# Patient Record
Sex: Female | Born: 1975 | Hispanic: No | Marital: Married | Smoking: Never smoker
Health system: Southern US, Community
[De-identification: ages and names within clinical notes are randomized; demographics above are authoritative.]

## PROBLEM LIST (undated history)

## (undated) DIAGNOSIS — L409 Psoriasis, unspecified: Secondary | ICD-10-CM

## (undated) DIAGNOSIS — K219 Gastro-esophageal reflux disease without esophagitis: Secondary | ICD-10-CM

## (undated) DIAGNOSIS — D582 Other hemoglobinopathies: Secondary | ICD-10-CM

## (undated) DIAGNOSIS — T8859XA Other complications of anesthesia, initial encounter: Secondary | ICD-10-CM

## (undated) DIAGNOSIS — F419 Anxiety disorder, unspecified: Secondary | ICD-10-CM

## (undated) DIAGNOSIS — G43909 Migraine, unspecified, not intractable, without status migrainosus: Secondary | ICD-10-CM

## (undated) DIAGNOSIS — O24419 Gestational diabetes mellitus in pregnancy, unspecified control: Secondary | ICD-10-CM

## (undated) DIAGNOSIS — T4145XA Adverse effect of unspecified anesthetic, initial encounter: Secondary | ICD-10-CM

## (undated) HISTORY — DX: Psoriasis, unspecified: L40.9

## (undated) HISTORY — PX: EYE SURGERY: SHX253

## (undated) HISTORY — DX: Other hemoglobinopathies: D58.2

## (undated) HISTORY — PX: REFRACTIVE SURGERY: SHX103

## (undated) HISTORY — DX: Migraine, unspecified, not intractable, without status migrainosus: G43.909

## (undated) HISTORY — PX: BREAST CYST EXCISION: SHX579

## (undated) HISTORY — DX: Gestational diabetes mellitus in pregnancy, unspecified control: O24.419

## (undated) HISTORY — DX: Adverse effect of unspecified anesthetic, initial encounter: T41.45XA

## (undated) HISTORY — PX: APPENDECTOMY: SHX54

## (undated) HISTORY — DX: Anxiety disorder, unspecified: F41.9

## (undated) HISTORY — DX: Gastro-esophageal reflux disease without esophagitis: K21.9

## (undated) HISTORY — PX: OTHER SURGICAL HISTORY: SHX169

## (undated) HISTORY — DX: Other complications of anesthesia, initial encounter: T88.59XA

---

## 2012-04-12 ENCOUNTER — Encounter: Payer: Self-pay | Admitting: Family Medicine

## 2012-04-12 ENCOUNTER — Telehealth: Payer: Self-pay | Admitting: Family Medicine

## 2012-04-12 ENCOUNTER — Ambulatory Visit (INDEPENDENT_AMBULATORY_CARE_PROVIDER_SITE_OTHER): Payer: No Typology Code available for payment source | Admitting: Family Medicine

## 2012-04-12 VITALS — BP 102/78 | HR 91 | Temp 98.3°F | Ht 69.0 in | Wt 179.0 lb

## 2012-04-12 DIAGNOSIS — Z7689 Persons encountering health services in other specified circumstances: Secondary | ICD-10-CM

## 2012-04-12 DIAGNOSIS — F419 Anxiety disorder, unspecified: Secondary | ICD-10-CM

## 2012-04-12 DIAGNOSIS — G43909 Migraine, unspecified, not intractable, without status migrainosus: Secondary | ICD-10-CM

## 2012-04-12 DIAGNOSIS — Z7189 Other specified counseling: Secondary | ICD-10-CM

## 2012-04-12 DIAGNOSIS — F411 Generalized anxiety disorder: Secondary | ICD-10-CM

## 2012-04-12 LAB — LIPID PANEL
Cholesterol: 156 mg/dL (ref 0–200)
LDL Cholesterol: 94 mg/dL (ref 0–99)
Triglycerides: 49 mg/dL (ref 0.0–149.0)

## 2012-04-12 LAB — BASIC METABOLIC PANEL
BUN: 10 mg/dL (ref 6–23)
Calcium: 9.4 mg/dL (ref 8.4–10.5)
Chloride: 106 mEq/L (ref 96–112)
Creatinine, Ser: 0.8 mg/dL (ref 0.4–1.2)
GFR: 85.45 mL/min (ref >60.00)

## 2012-04-12 LAB — CBC WITH DIFFERENTIAL/PLATELET
Basophils Absolute: 0 10*3/uL (ref 0.0–0.1)
Basophils Relative: 0.2 % (ref 0.0–3.0)
Eosinophils Absolute: 0.1 10*3/uL (ref 0.0–0.7)
Hemoglobin: 12.4 g/dL (ref 12.0–15.0)
Lymphocytes Relative: 28.7 % (ref 12.0–46.0)
Lymphs Abs: 1.7 10*3/uL (ref 0.7–4.0)
MCHC: 32.4 g/dL (ref 30.0–36.0)
MCV: 71.7 fl — ABNORMAL LOW (ref 78.0–100.0)
Monocytes Absolute: 0.3 10*3/uL (ref 0.1–1.0)
Neutro Abs: 3.8 10*3/uL (ref 1.4–7.7)
RBC: 5.32 Mil/uL — ABNORMAL HIGH (ref 3.87–5.11)
RDW: 14.8 % — ABNORMAL HIGH (ref 11.5–14.6)

## 2012-04-12 LAB — HEMOGLOBIN A1C: Hgb A1c MFr Bld: 6.2 % (ref 4.6–6.5)

## 2012-04-12 NOTE — Patient Instructions (Addendum)
-  We have ordered labs or studies at this visit. It can take up to 1-2 weeks for results and processing. We will contact you with instructions IF your results are abnormal. Normal results will be released to your [facility]. If you have not heard from [place] or can not find your results in [facility] in 2 weeks please contact our office.  -We placed a referral for you as discussed to a neurologist. It usually takes about 1-2 weeks to process and schedule this referral. If you have not heard from [place] regarding this appointment in 2 weeks please contact our office.   -PLEASE SIGN UP FOR MYCHART TODAY   We recommend the following healthy lifestyle measures: - eat a healthy diet consisting of lots of vegetables, fruits, beans, nuts, seeds, healthy meats such as white chicken and fish and whole grains.  - avoid fried foods, fast food, processed foods, sodas, red meet and other fattening foods.  - get a least 150 minutes of aerobic exercise per week.   Follow up in: 1 month  Schedule and appointment with an eye doctor and with counselor

## 2012-04-12 NOTE — Telephone Encounter (Signed)
Please let patient know,   -labs look pretty good for the most part -diabetes screening lab is a little high (>5.6) indicating a risk for developing diabetes  The best treatment to hopefully reverse these findings and prevent adverse health outcomes is a healthy diet and regular exercise.  Follow up as scheduled

## 2012-04-12 NOTE — Progress Notes (Signed)
Chief Complaint  Patient presents with  . Establish Care    HPI:  Diane Kirk is here to establish care. Recently moved to the area. Last PCP and physical: Dr. Veatrice Bourbon - Family [facility], Dr. 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 Migraine   Has a history of Vit D Deficiency.  Migraine headaches > 1 year - reports Fioricet prescribed by last doctor, had CT last week at ED and was normal. Wants to see neurologist. Gets these headaches a few times per month, usually R sided, associated with nausea, blurred vision. Recently started on triptan to use for headaches. Has tried amitriptyline and other meds as well with prior PCP. Pt also has anxiety, trouble sleeping, does not want to take medications. Sometimes has tremor in hand when anxious. Sometimes has night sweats - has had this chronically. Denies weight loss, anorexia, change in bowels, fevers.  Health Maintenance: -refused flu   ROS: See pertinent positives and negatives per HPI.  History reviewed. No pertinent past medical history.  Family History  Problem Relation Age of Onset  . Hypertension Mother   . Hyperlipidemia Mother   . Hypertension Father   . Hyperlipidemia Father   . Stroke Father 94  . Diabetes Paternal Aunt   . Heart disease Paternal Uncle 71  . Cancer Maternal Grandmother     breast    History   Social History  . Marital Status: Married    Spouse Name: N/A    Number of Children: N/A  . Years of Education: N/A   Social History Main Topics  . Smoking status: Never Smoker   . Smokeless tobacco: None  . Alcohol Use: No  . Drug Use: None  . Sexually Active: Yes   Other Topics Concern  . None   Social  History Narrative  . None    Current outpatient prescriptions:Multiple Vitamin (MULTIVITAMIN) tablet, Take 1 tablet by mouth daily., Disp: , Rfl:   EXAM:  Filed Vitals:   04/12/12 0819  BP: 102/78  Pulse: 91  Temp: 98.3 F (36.8 C)    Body mass index is 26.43 kg/(m^2).  GENERAL: vitals reviewed and listed above, alert, oriented, appears well hydrated and in no acute distress  HEENT: atraumatic, conjunttiva clear, no obvious abnormalities on inspection of external nose and ears  NECK: no obvious masses on inspection  LUNGS: clear to auscultation bilaterally, no wheezes, rales or rhonchi, good air movement  CV: HRRR, no peripheral edema  MS: moves all extremities without noticeable abnormality  PSYCH: pleasant and cooperative, no obvious depression or anxiety  ASSESSMENT AND PLAN:  Discussed the following assessment and plan:  1. Migraine  -CT neg -sounds like migraine - offered to tx - pt wants to see neurologist - referral placed Ambulatory referral to Neurology  2. Establishing care with new doctor, encounter for   Hemoglobin A1c, Lipid Panel, CBC with Differential, Basic metabolic panel  3. Anxiety  -advised counseling Follow up 1 month, may consider adding TCA or SSRI   -We reviewed the PMH, PSH, FH, SH, Meds and Allergies. -We provided refills for any medications we will prescribe as needed. -We addressed current concerns per orders and patient instructions. -We have asked for records  for pertinent exams, studies, vaccines and notes from previous providers. -We have advised patient to follow up per instructions below. -Influenza vaccine given today  -Patient advised to return or notify a doctor immediately if symptoms worsen or persist or new concerns arise.  Patient Instructions  -We have ordered labs or studies at this visit. It can take up to 1-2 weeks for results and processing. We will contact you with instructions IF your results are abnormal. Normal  results will be released to your [facility]. If you have not heard from [place] or can not find your results in [facility] in 2 weeks please contact our office.  -We placed a referral for you as discussed to a neurologist. It usually takes about 1-2 weeks to process and schedule this referral. If you have not heard from [place] regarding this appointment in 2 weeks please contact our office.   -PLEASE SIGN UP FOR MYCHART TODAY   We recommend the following healthy lifestyle measures: - eat a healthy diet consisting of lots of vegetables, fruits, beans, nuts, seeds, healthy meats such as white chicken and fish and whole grains.  - avoid fried foods, fast food, processed foods, sodas, red meet and other fattening foods.  - get a least 150 minutes of aerobic exercise per week.   Follow up in: 1 month  Schedule and appointment with an eye doctor and with counselor        Terressa Koyanagi.

## 2012-04-13 ENCOUNTER — Telehealth: Payer: Self-pay | Admitting: Family Medicine

## 2012-04-13 NOTE — Telephone Encounter (Signed)
Please let pt know - in regards to her question.  No, only mildly outside of range and likely means nothing. Advised healthy diet and regular exercise and can recheck next time we do labs.

## 2012-04-13 NOTE — Telephone Encounter (Signed)
Mychart message sent to patient.

## 2012-04-16 NOTE — Telephone Encounter (Signed)
Pt aware.

## 2012-05-04 ENCOUNTER — Encounter: Payer: Self-pay | Admitting: Family Medicine

## 2012-05-04 NOTE — Progress Notes (Signed)
Received OV note from [facility] from 05/02/12. Dx Migraines and Lhermitte's phenomenon. Neuro Plan: topamax, zomig prn, MRI brain and cervical spine and follow up in 3 months.

## 2012-05-12 ENCOUNTER — Other Ambulatory Visit: Payer: Self-pay

## 2012-05-15 ENCOUNTER — Encounter: Payer: No Typology Code available for payment source | Admitting: Family Medicine

## 2012-05-15 NOTE — Progress Notes (Signed)
No show   This encounter was created in error - please disregard.  This encounter was created in error - please disregard.

## 2012-06-07 ENCOUNTER — Ambulatory Visit (INDEPENDENT_AMBULATORY_CARE_PROVIDER_SITE_OTHER): Payer: No Typology Code available for payment source | Admitting: Family Medicine

## 2012-06-07 ENCOUNTER — Encounter: Payer: Self-pay | Admitting: Family Medicine

## 2012-06-07 VITALS — BP 100/70 | HR 81 | Temp 99.1°F | Wt 179.0 lb

## 2012-06-07 DIAGNOSIS — R7303 Prediabetes: Secondary | ICD-10-CM

## 2012-06-07 DIAGNOSIS — F411 Generalized anxiety disorder: Secondary | ICD-10-CM

## 2012-06-07 DIAGNOSIS — G43909 Migraine, unspecified, not intractable, without status migrainosus: Secondary | ICD-10-CM

## 2012-06-07 DIAGNOSIS — R7309 Other abnormal glucose: Secondary | ICD-10-CM

## 2012-06-07 NOTE — Progress Notes (Signed)
Chief Complaint  Patient presents with  . 1 month follow up    HPI:  Follow up:  Anxiety: -advised counseling last visit - she reports did not do this -has been doing a Public librarian class and this has helped immensely  -exercise and support: has a great support   Migraines and Lherrmitte penomenom: -per neuro notes -started on topamax and zomig  -reports MRI was all good and told symptoms were Migraines - has follow up in May -headaches have improved significantly - she is not taking the topamax - using the zomig sparingly  Prediabetes: -on recent labs -is trying to eat healthy and is going to start exercising  ROS: See pertinent positives and negatives per HPI.  No past medical history on file.  Family History  Problem Relation Age of Onset  . Hypertension Mother   . Hyperlipidemia Mother   . Hypertension Father   . Hyperlipidemia Father   . Stroke Father 35  . Diabetes Paternal Aunt   . Heart disease Paternal Uncle 46  . Cancer Maternal Grandmother     breast    History   Social History  . Marital Status: Married    Spouse Name: N/A    Number of Children: N/A  . Years of Education: N/A   Social History Main Topics  . Smoking status: Never Smoker   . Smokeless tobacco: None  . Alcohol Use: No  . Drug Use: None  . Sexually Active: Yes   Other Topics Concern  . None   Social History Narrative  . None    Current outpatient prescriptions:Multiple Vitamin (MULTIVITAMIN) tablet, Take 1 tablet by mouth daily., Disp: , Rfl: ;  zolmitriptan (ZOMIG-ZMT) 2.5 MG disintegrating tablet, Take 2.5 mg by mouth as needed. , Disp: , Rfl:   EXAM:  Filed Vitals:   06/07/12 1024  BP: 100/70  Pulse: 81  Temp: 99.1 F (37.3 C)    Body mass index is 26.42 kg/(m^2).  GENERAL: vitals reviewed and listed above, alert, oriented, appears well hydrated and in no acute distress  HEENT: atraumatic, conjunttiva clear, no obvious abnormalities on inspection of  external nose and ears  NECK: no obvious masses on inspection  LUNGS: clear to auscultation bilaterally, no wheezes, rales or rhonchi, good air movement  CV: HRRR, no peripheral edema  MS: moves all extremities without noticeable abnormality  PSYCH: pleasant and cooperative, no obvious depression or anxiety  ASSESSMENT AND PLAN:  Discussed the following assessment and plan:  Migraine  Generalized anxiety disorder  Prediabetes  -doing well -discussed healthy lifestyle recs at length -goal to start exercise videos and improve diet, continue spiritual support -has follow up with neuro for her migraines - but is doing much better, has not used zomig in one month and not taking topamax -follow up in 6 months or prn -Patient advised to return or notify a doctor immediately if symptoms worsen or persist or new concerns arise.  There are no Patient Instructions on file for this visit.   Kriste Basque R.

## 2012-07-12 ENCOUNTER — Ambulatory Visit (INDEPENDENT_AMBULATORY_CARE_PROVIDER_SITE_OTHER): Payer: No Typology Code available for payment source | Admitting: Family Medicine

## 2012-07-12 ENCOUNTER — Telehealth: Payer: Self-pay | Admitting: Family Medicine

## 2012-07-12 ENCOUNTER — Ambulatory Visit: Payer: No Typology Code available for payment source | Admitting: Family

## 2012-07-12 ENCOUNTER — Encounter: Payer: Self-pay | Admitting: Family Medicine

## 2012-07-12 ENCOUNTER — Other Ambulatory Visit (HOSPITAL_COMMUNITY)
Admission: RE | Admit: 2012-07-12 | Discharge: 2012-07-12 | Disposition: A | Discharge status: Home / Self Care | Payer: No Typology Code available for payment source | Source: Ambulatory Visit | Attending: Family Medicine | Admitting: Family Medicine

## 2012-07-12 VITALS — BP 106/70 | HR 73 | Temp 98.1°F | Wt 175.0 lb

## 2012-07-12 DIAGNOSIS — N760 Acute vaginitis: Secondary | ICD-10-CM | POA: Insufficient documentation

## 2012-07-12 DIAGNOSIS — N926 Irregular menstruation, unspecified: Secondary | ICD-10-CM

## 2012-07-12 DIAGNOSIS — N898 Other specified noninflammatory disorders of vagina: Secondary | ICD-10-CM

## 2012-07-12 NOTE — Telephone Encounter (Signed)
Patient Information:  Caller Name: Serina  Phone: [phone]  Patient: Diane Kirk, Diane Kirk  Gender: Female  DOB: 1975-11-27  Age: 37 Years  PCP: Kriste Basque [facility])  Pregnant: No  Office Follow Up:  Does the office need to follow up with this patient?: No  Instructions For The Office: N/A   Symptoms  Reason For Call & Symptoms: Patient reports bleeding intermittently for 3 weeks.  Pinkish color; wears tampons with very little on them.  LMP 06/07/12 with light spotting; went away and returned around 06/25/12.  Patient wants to be seen.  Reviewed Health History In EMR: Yes  Reviewed Medications In EMR: Yes  Reviewed Allergies In EMR: Yes  Reviewed Surgeries / Procedures: Yes  Date of Onset of Symptoms: 06/25/2012 OB / GYN:  LMP: 06/07/2012  Guideline(s) Used:  Vaginal Bleeding - Abnormal  Disposition Per Guideline:   See Today in Office  Reason For Disposition Reached:   Patient wants to be seen  Advice Given:  Pregnancy Test, When in Doubt:  If there is a chance that you might be pregnant, use a urine pregnancy test.  It works best if you test your first urine in the morning.  Call Back If:  Pregnancy test is positive  Bleeding becomes worse  You become worse.  Patient Will Follow Care Advice:  YES  Appointment Scheduled:  07/12/2012 15:45:00 Appointment Scheduled Provider:  Adline Mango [facility])

## 2012-07-12 NOTE — Progress Notes (Signed)
Chief Complaint  Patient presents with  . Vaginal Discharge    times 3 weeks    HPI:  Vaginal discharge: -FDLMP: 06/07/12 - normal -reports periods have always been irr her whole life - sometimes 2 weeks sometimes 5 weeks -had pinkish tinged on TP when wiped on march 31st - has had a few episodes of white sometimes pinkish tinged discharge on TP when wipes -not getting blood on underwear - has worn a tampon a few days with nothing on tampon -no odor, no pain or pruritis, fevers, urinary or bowel issues -she goes to [facility] - had pelvic and pap in November -not on birth control -no new sexual partners - only with husband  ROS: See pertinent positives and negatives per HPI.  No past medical history on file.  Family History  Problem Relation Age of Onset  . Hypertension Mother   . Hyperlipidemia Mother   . Hypertension Father   . Hyperlipidemia Father   . Stroke Father 84  . Diabetes Paternal Aunt   . Heart disease Paternal Uncle 46  . Cancer Maternal Grandmother     breast    History   Social History  . Marital Status: Married    Spouse Name: N/A    Number of Children: N/A  . Years of Education: N/A   Social History Main Topics  . Smoking status: Never Smoker   . Smokeless tobacco: None  . Alcohol Use: No  . Drug Use: None  . Sexually Active: Yes   Other Topics Concern  . None   Social History Narrative  . None    Current outpatient prescriptions:Multiple Vitamin (MULTIVITAMIN) tablet, Take 1 tablet by mouth daily., Disp: , Rfl: ;  zolmitriptan (ZOMIG-ZMT) 2.5 MG disintegrating tablet, Take 2.5 mg by mouth as needed. , Disp: , Rfl:   EXAM:  Filed Vitals:   07/12/12 1558  BP: 106/70  Pulse: 73  Temp: 98.1 F (36.7 C)    Body mass index is 25.83 kg/(m^2).  GENERAL: vitals reviewed and listed above, alert, oriented, appears well hydrated and in no acute distress  HEENT: atraumatic, conjunttiva clear, no obvious abnormalities on inspection of  external nose and ears  NECK: no obvious masses on inspection  LUNGS: clear to auscultation bilaterally, no wheezes, rales or rhonchi, good air movement  CV: HRRR, no peripheral edema  ABD: normal  GU: normal appearance of ext genitalia and vagina, very small amount cervical bleeding, white homogenous vaginal discharge, no CMT   MS: moves all extremities without noticeable abnormality  PSYCH: pleasant and cooperative, no obvious depression or anxiety  ASSESSMENT AND PLAN:  Discussed the following assessment and plan:  Irregular bleeding - Plan: Cervicovaginal ancillary only  Vaginal discharge  -wet prep pending -seems like very sm amount of possible uterine bleeding with hx of irr  Periods her whole life. Discussed workup and tx options. She will wait on wet prep results and if persists see gyn. Advised if heavy beelding to see a doctor immediately.  Would likely benfit from OCPs to regulate cycles. No heavy bleeding at this time. -Patient advised to return or notify a doctor immediately if symptoms worsen or persist or new concerns arise.  There are no Patient Instructions on file for this visit.   Kriste Basque R.

## 2012-07-17 ENCOUNTER — Telehealth: Payer: Self-pay | Admitting: Family Medicine

## 2012-07-17 DIAGNOSIS — A5901 Trichomonal vulvovaginitis: Secondary | ICD-10-CM

## 2012-07-17 MED ORDER — METRONIDAZOLE 500 MG PO TABS: 2000.0000 mg | ORAL_TABLET | Freq: Once | ORAL | Status: DC

## 2012-07-17 NOTE — Telephone Encounter (Signed)
Caller: Danaly/Patient; Phone: [phone]; Reason for Call: Patient called requesting to have a retest because she is confused by the results (trichomonas) of her test.  She advised RN that she was planning to follow up with her Gyn physician for this retest.  Thank you,

## 2012-07-17 NOTE — Telephone Encounter (Signed)
Called to discuss results with patient. Answered questions. Pt wants to retest tomorrow when she see her gyn and will do testing for other STIs as well then.

## 2012-07-17 NOTE — Telephone Encounter (Signed)
Please let her know: -test results showed trichamonas. This can be there for many many years - so may not be something new. It is a sexually transmitted infection It could be the cause of her pink discharge. We can treat this with one dose of medicine. Sent to pharmacy. Metronidazole 2 gram x1.

## 2012-07-17 NOTE — Telephone Encounter (Signed)
Called and spoke with pt and pt is aware.  

## 2012-08-02 DIAGNOSIS — R0602 Shortness of breath: Secondary | ICD-10-CM | POA: Insufficient documentation

## 2012-08-02 DIAGNOSIS — K3189 Other diseases of stomach and duodenum: Secondary | ICD-10-CM | POA: Insufficient documentation

## 2012-08-02 DIAGNOSIS — R1013 Epigastric pain: Secondary | ICD-10-CM | POA: Insufficient documentation

## 2012-08-02 DIAGNOSIS — R630 Anorexia: Secondary | ICD-10-CM | POA: Insufficient documentation

## 2012-08-02 DIAGNOSIS — K219 Gastro-esophageal reflux disease without esophagitis: Secondary | ICD-10-CM | POA: Insufficient documentation

## 2012-08-03 ENCOUNTER — Other Ambulatory Visit: Payer: Self-pay

## 2012-08-03 ENCOUNTER — Emergency Department (HOSPITAL_COMMUNITY)
Admission: EM | Admit: 2012-08-03 | Discharge: 2012-08-03 | Disposition: A | Discharge status: Home / Self Care | Payer: No Typology Code available for payment source | Attending: Emergency Medicine | Admitting: Emergency Medicine

## 2012-08-03 ENCOUNTER — Encounter (HOSPITAL_COMMUNITY): Payer: Self-pay | Admitting: Emergency Medicine

## 2012-08-03 DIAGNOSIS — K219 Gastro-esophageal reflux disease without esophagitis: Secondary | ICD-10-CM

## 2012-08-03 LAB — CBC
MCV: 66.6 fL — ABNORMAL LOW (ref 78.0–100.0)
Platelets: 237 10*3/uL (ref 150–400)
RBC: 5.51 MIL/uL — ABNORMAL HIGH (ref 3.87–5.11)
RDW: 13.9 % (ref 11.5–15.5)
WBC: 8.5 10*3/uL (ref 4.0–10.5)

## 2012-08-03 LAB — POCT I-STAT, CHEM 8
BUN: 4 mg/dL — ABNORMAL LOW (ref 6–23)
Calcium, Ion: 1.24 mmol/L — ABNORMAL HIGH (ref 1.12–1.23)
Creatinine, Ser: 0.9 mg/dL (ref 0.50–1.10)
Hemoglobin: 14.6 g/dL (ref 12.0–15.0)
TCO2: 24 mmol/L (ref 0–100)

## 2012-08-03 LAB — POCT I-STAT TROPONIN I
Troponin i, poc: 0 ng/mL (ref 0.00–0.08)
Troponin i, poc: 0 ng/mL (ref 0.00–0.08)

## 2012-08-03 LAB — D-DIMER, QUANTITATIVE: D-Dimer, Quant: <0.27 ug/mL-FEU (ref 0.00–0.48)

## 2012-08-03 MED ORDER — OMEPRAZOLE 20 MG PO CPDR: 20.0000 mg | DELAYED_RELEASE_CAPSULE | Freq: Every day | ORAL | Status: DC

## 2012-08-03 MED ORDER — GI COCKTAIL ~~LOC~~
30.0000 mL | Freq: Once | ORAL | Status: AC
  Administered 2012-08-03: 30 mL via ORAL
  Filled 2012-08-03: qty 30

## 2012-08-03 NOTE — ED Notes (Signed)
Patient with chest pain and shortness of breath that started around 8pm tonight.  Patient states that the pain changed from sharp to burning sensation.  Patient continues with the burning sensation at this time.

## 2012-08-03 NOTE — ED Provider Notes (Signed)
History     CSN: [id number]  Arrival date & time 08/02/12  2354   First MD Initiated Contact with Patient 08/03/12 0033      Chief Complaint  Patient presents with  . Chest Pain  . Shortness of Breath    (Consider location/radiation/quality/duration/timing/severity/associated sxs/prior treatment) Patient is a 37 y.o. female presenting with chest pain and shortness of breath. The history is provided by the patient.  Chest Pain Pain location:  Epigastric Pain quality: burning   Pain radiates to:  Does not radiate Pain radiates to the back: no   Pain severity:  Moderate Onset quality:  Sudden Timing:  Constant Progression:  Unchanged Chronicity:  New Context: no trauma   Relieved by:  Nothing Worsened by:  Nothing tried Associated symptoms: anorexia   Associated symptoms: no cough, no fatigue, no palpitations and not vomiting   Associated symptoms comment:  Dyspepsia x 24 hours Shortness of Breath Associated symptoms: chest pain   Associated symptoms: no cough, no vomiting and no wheezing     History reviewed. No pertinent past medical history.  Past Surgical History  Procedure Laterality Date  . Appendectomy    . Laproscopy    . Ivf    . Breast cyst excision      Family History  Problem Relation Age of Onset  . Hypertension Mother   . Hyperlipidemia Mother   . Hypertension Father   . Hyperlipidemia Father   . Stroke Father 20  . Diabetes Paternal Aunt   . Heart disease Paternal Uncle 75  . Cancer Maternal Grandmother     breast    History  Substance Use Topics  . Smoking status: Never Smoker   . Smokeless tobacco: Not on file  . Alcohol Use: No    OB History   Grav Para Term Preterm Abortions TAB SAB Ect Mult Living                  Review of Systems  Constitutional: Negative for fatigue.  Respiratory: Negative for cough and wheezing.   Cardiovascular: Positive for chest pain. Negative for palpitations and leg swelling.  Gastrointestinal:  Positive for anorexia. Negative for vomiting.  All other systems reviewed and are negative.    Allergies  Azithromycin  Home Medications   Current Outpatient Rx  Name  Route  Sig  Dispense  Refill  . zolmitriptan (ZOMIG-ZMT) 2.5 MG disintegrating tablet   Oral   Take 2.5 mg by mouth as needed.            BP 105/73  Pulse 80  Temp(Src) 97.8 F (36.6 C) (Oral)  Resp 15  Ht 5\' 10"  (1.778 m)  Wt 170 lb (77.111 kg)  BMI 24.39 kg/m2  SpO2 98%  LMP 07/24/2012  Physical Exam  Constitutional: She is oriented to person, place, and time. She appears well-developed and well-nourished. No distress.  HENT:  Head: Normocephalic and atraumatic.  Mouth/Throat: Oropharynx is clear and moist.  Eyes: Conjunctivae are normal. Pupils are equal, round, and reactive to light.  Neck: Normal range of motion. Neck supple.  Cardiovascular: Normal rate, regular rhythm and intact distal pulses.   Pulmonary/Chest: Effort normal and breath sounds normal. She has no wheezes. She has no rales.  Abdominal: Soft. Bowel sounds are increased. There is no tenderness. There is no rebound and no guarding.  Musculoskeletal: Normal range of motion.  Neurological: She is alert and oriented to person, place, and time.  Skin: Skin is warm and dry.  Psychiatric:  She has a normal mood and affect.    ED Course  Procedures (including critical care time)  Labs Reviewed  CBC - Abnormal; Notable for the following:    RBC 5.51 (*)    MCV 66.6 (*)    MCH 23.2 (*)    All other components within normal limits  POCT I-STAT, CHEM 8 - Abnormal; Notable for the following:    BUN 4 (*)    Glucose, Bld 120 (*)    Calcium, Ion 1.24 (*)    All other components within normal limits  D-DIMER, QUANTITATIVE  POCT I-STAT TROPONIN I   No results found.   No diagnosis found.    MDM   Date: 08/03/2012  Rate: 88  Rhythm: normal sinus rhythm  QRS Axis: normal  Intervals: normal  ST/T Wave abnormalities: normal   Conduction Disutrbances: PVC  Narrative Interpretation: unremarkable         Sx consistent with GERD and in the setting of 2 negative troponins and a negative EKG acs is excluded.  Follow up with your family doctor for ongoing care  Harlie Ragle K Elena Davia-Rasch, MD 08/03/12 [phone]

## 2012-10-05 ENCOUNTER — Encounter: Payer: Self-pay | Admitting: Neurology

## 2012-10-05 ENCOUNTER — Ambulatory Visit (INDEPENDENT_AMBULATORY_CARE_PROVIDER_SITE_OTHER): Payer: No Typology Code available for payment source | Admitting: Neurology

## 2012-10-05 ENCOUNTER — Telehealth: Payer: Self-pay | Admitting: Neurology

## 2012-10-05 VITALS — BP 111/70 | HR 74 | Ht 70.25 in | Wt 168.0 lb

## 2012-10-05 DIAGNOSIS — G43909 Migraine, unspecified, not intractable, without status migrainosus: Secondary | ICD-10-CM

## 2012-10-05 MED ORDER — RIZATRIPTAN BENZOATE 10 MG PO TBDP: 10.0000 mg | ORAL_TABLET | Freq: Three times a day (TID) | ORAL | Status: DC | PRN

## 2012-10-05 NOTE — Progress Notes (Signed)
Reason for visit: Migraine headache  Diane Kirk is an 37 y.o. female  History of present illness:  Diane Kirk is a 37 year old right-handed black female with a history of migraine headaches. The patient was given a prescription for Topamax, as she was having frequent headaches. The patient never took the medication because she was afraid of potential side effects. The patient has been on low-dose Zomig taking the 2.5 mg tablets, and she indicates that this has not been very effective for her. The headaches have reduced to having one headache a month, associated with the menstrual cycle. The headache itself however, may last 3 days. The patient indicates that she has done better by avoiding pork, MSG in the diet, and avoiding sweet odors such as perfumes. The patient does have some nausea with the headache. The patient indicates that she may frequently wake up with a slight tremor that dissipates quickly, and she does not have a tremor at other times. The patient returns for an evaluation. MRI of the cervical spine and brain were unremarkable. This was done mainly because of a history of a  Lhermitte's phenomenon.   Past Medical History  Diagnosis Date  . Migraine headache   . Gastroesophageal reflux disease     Past Surgical History  Procedure Laterality Date  . Appendectomy    . Laproscopy    . Ivf    . Breast cyst excision    . Refractive surgery Bilateral   . Sebaceous cyst resection      Scalp    Family History  Problem Relation Age of Onset  . Hypertension Mother   . Hyperlipidemia Mother   . Hypertension Father   . Hyperlipidemia Father   . Stroke Father 17  . Diabetes Paternal Aunt   . Heart disease Paternal Uncle 86  . Cancer Maternal Grandmother     breast    Social history:  reports that she has never smoked. She does not have any smokeless tobacco history on file. She reports that she does not drink alcohol. Her drug history is not on file.  Allergies:   Allergies  Allergen Reactions  . Azithromycin     vertigo    Medications:  Current Outpatient Prescriptions on File Prior to Visit  Medication Sig Dispense Refill  . omeprazole (PRILOSEC) 20 MG capsule Take 1 capsule (20 mg total) by mouth daily.  30 capsule  0   No current facility-administered medications on file prior to visit.    ROS:  Out of a complete 14 system review of symptoms, the patient complains only of the following symptoms, and all other reviewed systems are negative.  Fatigue  Moles  Blurred vision Headache Tremor Insomnia   Blood pressure 111/70, pulse 74, height 5' 10.25" (1.784 m), weight 168 lb (76.204 kg).  Physical Exam  General: The patient is alert and cooperative at the time of the examination.  Skin: No significant peripheral edema is noted.   Neurologic Exam  Cranial nerves: Facial symmetry is present. Speech is normal, no aphasia or dysarthria is noted. Extraocular movements are full. Visual fields are full.  Motor: The patient has good strength in all 4 extremities.  Coordination: The patient has good finger-nose-finger and heel-to-shin bilaterally.  Gait and station: The patient has a normal gait. Tandem gait is normal. Romberg is negative. No drift is seen.  Reflexes: Deep tendon reflexes are symmetric.   Assessment/Plan:  One. Migraine headache, menstrual migraine  The patient is having on average one headache  a month, but the headache is lasting several days. The patient will begin a prescription for Maxalt to take for the headache at its onset. Samples for Frova were given to take for 3 days after the headache begins. The patient will contact me if she believes that Frova is helpful. The patient is off of Topamax, and we will not restart the medication for now. The patient will followup in 6 months.  Diane Palau MD 10/07/2012 7:47 PM  [facility] [address] [place], [place]  [address]  Phone [phone] Fax [phone]

## 2012-10-08 ENCOUNTER — Telehealth: Payer: Self-pay | Admitting: Neurology

## 2012-10-15 ENCOUNTER — Telehealth: Payer: Self-pay | Admitting: Neurology

## 2012-10-15 NOTE — Telephone Encounter (Signed)
PA was sent.  Pending response.  Called patient.  Got no answer.  Left message.

## 2012-11-29 ENCOUNTER — Encounter: Payer: Self-pay | Admitting: Family Medicine

## 2012-11-29 ENCOUNTER — Ambulatory Visit (INDEPENDENT_AMBULATORY_CARE_PROVIDER_SITE_OTHER): Payer: No Typology Code available for payment source | Admitting: Family Medicine

## 2012-11-29 VITALS — BP 104/78 | Temp 98.3°F | Wt 173.0 lb

## 2012-11-29 DIAGNOSIS — R7309 Other abnormal glucose: Secondary | ICD-10-CM

## 2012-11-29 DIAGNOSIS — M549 Dorsalgia, unspecified: Secondary | ICD-10-CM

## 2012-11-29 DIAGNOSIS — R7303 Prediabetes: Secondary | ICD-10-CM

## 2012-11-29 DIAGNOSIS — E559 Vitamin D deficiency, unspecified: Secondary | ICD-10-CM

## 2012-11-29 NOTE — Patient Instructions (Signed)
-  We have ordered labs or studies at this visit. It can take up to 1-2 weeks for results and processing. We will contact you with instructions IF your results are abnormal. Normal results will be released to your [facility]. If you have not heard from [place] or can not find your results in [facility] in 2 weeks please contact our office.  -for back: -excises provided at least 4 times per week -heat for 15 minutes twice daily -for pain can use tylenol 1000mg  up to 2 times daily or ibuprofen 400mg  up to 2 times daily and topical menthol or capsacin sports creams -follow up in 3-4 weeks if persists  We recommend the following healthy lifestyle measures: - eat a healthy diet consisting of lots of vegetables, fruits, beans, nuts, seeds, healthy meats such as white chicken and fish and whole grains.  - avoid fried foods, fast food, processed foods, sodas, red meet and other fattening foods.  - get a least 150 minutes of aerobic exercise per week.

## 2012-11-29 NOTE — Progress Notes (Signed)
Chief Complaint  Patient presents with  . Back Pain    and arm pain x 3 weeks on and off     HPI:  Acute visit for back pain: -started 2-3 weeks ago after sleeping wrong -in upper L back, neck and shoulder -asa helps, has also tried heat  -worse when in lying down positions  -denies: fevers, weakness, numbness in arms, malaise  Prediabetes: -wants to recheck hgba1c -no regular exercise, diet is good  Vit D def: -wants to recheck -not taking any vit D   ROS: See pertinent positives and negatives per HPI.  Past Medical History  Diagnosis Date  . Migraine headache   . Gastroesophageal reflux disease     Past Surgical History  Procedure Laterality Date  . Appendectomy    . Laproscopy    . Ivf    . Breast cyst excision    . Refractive surgery Bilateral   . Sebaceous cyst resection      Scalp    Family History  Problem Relation Age of Onset  . Hypertension Mother   . Hyperlipidemia Mother   . Hypertension Father   . Hyperlipidemia Father   . Stroke Father 28  . Diabetes Paternal Aunt   . Heart disease Paternal Uncle 23  . Cancer Maternal Grandmother     breast    History   Social History  . Marital Status: Married    Spouse Name: N/A    Number of Children: N/A  . Years of Education: N/A   Social History Main Topics  . Smoking status: Never Smoker   . Smokeless tobacco: None  . Alcohol Use: No  . Drug Use: None  . Sexual Activity: Yes   Other Topics Concern  . None   Social History Narrative  . None    Current outpatient prescriptions:omeprazole (PRILOSEC) 20 MG capsule, Take 1 capsule (20 mg total) by mouth daily., Disp: 30 capsule, Rfl: 0;  rizatriptan (MAXALT-MLT) 10 MG disintegrating tablet, Take 1 tablet (10 mg total) by mouth 3 (three) times daily as needed for migraine. May repeat in 2 hours if needed, Disp: 12 tablet, Rfl: 5  EXAM:  Filed Vitals:   11/29/12 0804  BP: 104/78  Temp: 98.3 F (36.8 C)    Body mass index is 24.66  kg/(m^2).  GENERAL: vitals reviewed and listed above, alert, oriented, appears well hydrated and in no acute distress  HEENT: atraumatic, conjunttiva clear, no obvious abnormalities on inspection of external nose and ears  NECK: no obvious masses on inspection  LUNGS: clear to auscultation bilaterally, no wheezes, rales or rhonchi, good air movement  CV: HRRR, no peripheral edema  MS: moves all extremities without noticeable abnormality Normal muscle strength, sensation in upper ext bilat, neg spurling, no bony cervical or thoracic TTP, TTP in L rhomboids and trap, otherwise normal exam, no winging scapula  PSYCH: pleasant and cooperative, no obvious depression or anxiety  ASSESSMENT AND PLAN:  Discussed the following assessment and plan:  Back pain -ms in nature -Recommendations per orders and instructions, risks and use of medications and return precautions discussed.  Prediabetes - Plan: Hemoglobin A1c -advised healthy lifestyle  Unspecified vitamin D deficiency - Plan: Vitamin D, 25-hydroxy  -Patient advised to return or notify a doctor immediately if symptoms worsen or persist or new concerns arise.  Patient Instructions  -We have ordered labs or studies at this visit. It can take up to 1-2 weeks for results and processing. We will contact you with instructions  IF your results are abnormal. Normal results will be released to your [facility]. If you have not heard from [place] or can not find your results in [facility] in 2 weeks please contact our office.  -for back: -excises provided at least 4 times per week -heat for 15 minutes twice daily -for pain can use tylenol 1000mg  up to 2 times daily or ibuprofen 400mg  up to 2 times daily and topical menthol or capsacin sports creams -follow up in 3-4 weeks if persists  We recommend the following healthy lifestyle measures: - eat a healthy diet consisting of lots of vegetables, fruits, beans, nuts, seeds, healthy meats such as white  chicken and fish and whole grains.  - avoid fried foods, fast food, processed foods, sodas, red meet and other fattening foods.  - get a least 150 minutes of aerobic exercise per week.            Kriste Basque R.

## 2012-11-30 NOTE — Progress Notes (Signed)
Quick Note:  Called and spoke with pt and pt is aware. ______ 

## 2013-01-31 ENCOUNTER — Other Ambulatory Visit: Payer: Self-pay

## 2013-04-05 ENCOUNTER — Telehealth: Payer: Self-pay | Admitting: Nurse Practitioner

## 2013-04-05 ENCOUNTER — Ambulatory Visit: Payer: No Typology Code available for payment source | Admitting: Nurse Practitioner

## 2013-04-05 NOTE — Telephone Encounter (Signed)
No show for scheduled appt 

## 2013-10-17 ENCOUNTER — Encounter: Payer: Self-pay | Admitting: *Deleted

## 2014-06-09 ENCOUNTER — Encounter: Payer: Self-pay | Admitting: Family Medicine

## 2014-06-09 ENCOUNTER — Ambulatory Visit (INDEPENDENT_AMBULATORY_CARE_PROVIDER_SITE_OTHER): Payer: No Typology Code available for payment source | Admitting: Family Medicine

## 2014-06-09 VITALS — BP 100/76 | HR 74 | Temp 97.9°F | Ht 69.5 in | Wt 178.6 lb

## 2014-06-09 DIAGNOSIS — J302 Other seasonal allergic rhinitis: Secondary | ICD-10-CM

## 2014-06-09 DIAGNOSIS — L503 Dermatographic urticaria: Secondary | ICD-10-CM

## 2014-06-09 DIAGNOSIS — Z0000 Encounter for general adult medical examination without abnormal findings: Secondary | ICD-10-CM | POA: Diagnosis not present

## 2014-06-09 LAB — LIPID PANEL
CHOL/HDL RATIO: 3
Cholesterol: 141 mg/dL (ref 0–200)
HDL: 49.3 mg/dL (ref >39.00)
LDL Cholesterol: 81 mg/dL (ref 0–99)
NONHDL: 91.7
TRIGLYCERIDES: 56 mg/dL (ref 0.0–149.0)
VLDL: 11.2 mg/dL (ref 0.0–40.0)

## 2014-06-09 LAB — HEMOGLOBIN A1C: Hgb A1c MFr Bld: 5.9 % (ref 4.6–6.5)

## 2014-06-09 NOTE — Progress Notes (Signed)
HPI:  Here for CPE:  -Concerns and/or follow up today:  Prediabetes: -notices when eats sweets feels "strange and tired" -want to check diabetes labs again -denies: polyuria, polydipsia, vision change  -Diet: variety of foods, balance and well rounded, larger portion sizes  -Exercise: no regular exercise  -Taking folic acid, vitamin D or calcium: no  -Diabetes and Dyslipidemia Screening: FASTING today  -Hx of HTN: no  -Vaccines: UTD, refuses flu  -pap history: 01/2013 w/ [facility] per her report and normal  -FDLMP: 06/06/14  -sexual activity: yes, female partner, no new partners  -wants STI testing: HIV, RPR - declined GC/Chlam, trich   -Alcohol, Tobacco, drug use: see social history  Review of Systems - no fevers, unintentional weight loss, vision loss, hearing loss, chest pain, sob, hemoptysis, melena, hematochezia, hematuria, genital discharge, changing or concerning skin lesions, bleeding, bruising, loc, thoughts of self harm or SI  Past Medical History  Diagnosis Date  . Migraine headache   . Gastroesophageal reflux disease     Past Surgical History  Procedure Laterality Date  . Appendectomy    . Laproscopy    . Ivf    . Breast cyst excision    . Refractive surgery Bilateral   . Sebaceous cyst resection      Scalp    Family History  Problem Relation Age of Onset  . Hypertension Mother   . Hyperlipidemia Mother   . Hypertension Father   . Hyperlipidemia Father   . Stroke Father [id number]  . Diabetes Paternal Aunt   . Heart disease Paternal Uncle [id number]  . Cancer Maternal Grandmother     breast    History   Social History  . Marital Status: Married    Spouse Name: N/A  . Number of Children: N/A  . Years of Education: N/A   Social History Main Topics  . Smoking status: Never Smoker   . Smokeless tobacco: Not on file  . Alcohol Use: No  . Drug Use: Not on file  . Sexual Activity: Yes   Other Topics Concern  . None   Social History Narrative    Work or School: stays at home      Home Situation: lives with husband and daughter 485 yo (kendall) in 2016      Spiritual Beliefs: Christian      Lifestyle: no regular exercise; diet is ok          No current outpatient prescriptions on file.  EXAM:  Filed Vitals:   06/09/14 0821  BP: 100/76  Pulse: 74  Temp: 97.9 F (36.6 C)    GENERAL: vitals reviewed and listed below, alert, oriented, appears well hydrated and in no acute distress  HEENT: head atraumatic, PERRLA, normal appearance of eyes, ears, nose and mouth. moist mucus membranes.  NECK: supple, no masses or lymphadenopathy  LUNGS: clear to auscultation bilaterally, no rales, rhonchi or wheeze  CV: HRRR, no peripheral edema or cyanosis, normal pedal pulses  BREAST: declined  ABDOMEN: bowel sounds normal, soft, non tender to palpation, no masses, no rebound or guarding  GU: declined  RECTAL: refused  SKIN: no rash or abnormal lesions  MS: normal gait, moves all extremities normally  NEURO: CN II-XII grossly intact, normal muscle strength and sensation to light touch on extremities  PSYCH: normal affect, pleasant and cooperative  ASSESSMENT AND PLAN:  Discussed the following assessment and plan:  Visit for preventive health examination - Plan: Hemoglobin A1c, Lipid panel, HIV antibody (with reflex), RPR  Dermatographia  Seasonal allergies  -Discussed and advised all [place] preventive services health task force level A and B recommendations for age, sex and risks.  -Advised at least 150 minutes of exercise per week and a healthy diet low in saturated fats and sweets and consisting of fresh fruits and vegetables, lean meats such as fish and white chicken and whole grains.  -FASTING labs, studies and vaccines per orders this encounter  Orders Placed This Encounter  Procedures  . Hemoglobin A1c  . Lipid panel  . HIV antibody (with reflex)  . RPR    Patient advised to return to clinic immediately  if symptoms worsen or persist or new concerns.  Patient Instructions  BEFORE YOU LEAVE: -labs  -We have ordered labs or studies at this visit. It can take up to 1-2 weeks for results and processing. We will contact you with instructions IF your results are abnormal. Normal results will be released to your [facility]. If you have not heard from [place] or can not find your results in [facility] in 2 weeks please contact our office.  We recommend the following healthy lifestyle measures: - eat a healthy diet consisting of lots of vegetables, fruits, beans, nuts, seeds, healthy meats such as white chicken and fish and whole grains.  - avoid fried foods, fast food, processed foods, sodas, red meet and other fattening foods.  - get a least 150 minutes of aerobic exercise per week.             No Follow-up on file.  Kriste Basque R.

## 2014-06-09 NOTE — Patient Instructions (Signed)
BEFORE YOU LEAVE: -labs  -We have ordered labs or studies at this visit. It can take up to 1-2 weeks for results and processing. We will contact you with instructions IF your results are abnormal. Normal results will be released to your [facility]. If you have not heard from us or can not find your results in [facility] in 2 weeks please contact our office.  We recommend the following healthy lifestyle measures: - eat a healthy diet consisting of lots of vegetables, fruits, beans, nuts, seeds, healthy meats such as white chicken and fish and whole grains.  - avoid fried foods, fast food, processed foods, sodas, red meet and other fattening foods.  - get a least 150 minutes of aerobic exercise per week.   

## 2014-06-09 NOTE — Progress Notes (Signed)
Pre visit review using our clinic review tool, if applicable. No additional management support is needed unless otherwise documented below in the visit note. 

## 2014-06-10 LAB — RPR

## 2014-06-10 LAB — HIV ANTIBODY (ROUTINE TESTING W REFLEX): HIV 1&2 Ab, 4th Generation: NONREACTIVE

## 2014-08-13 ENCOUNTER — Emergency Department (HOSPITAL_COMMUNITY)
Admission: EM | Admit: 2014-08-13 | Discharge: 2014-08-13 | Disposition: A | Discharge status: Home / Self Care | Payer: No Typology Code available for payment source | Attending: Emergency Medicine | Admitting: Emergency Medicine

## 2014-08-13 ENCOUNTER — Encounter (HOSPITAL_COMMUNITY): Payer: Self-pay

## 2014-08-13 DIAGNOSIS — R42 Dizziness and giddiness: Secondary | ICD-10-CM | POA: Diagnosis not present

## 2014-08-13 DIAGNOSIS — Z8719 Personal history of other diseases of the digestive system: Secondary | ICD-10-CM | POA: Insufficient documentation

## 2014-08-13 DIAGNOSIS — Z8679 Personal history of other diseases of the circulatory system: Secondary | ICD-10-CM | POA: Diagnosis not present

## 2014-08-13 DIAGNOSIS — R51 Headache: Secondary | ICD-10-CM | POA: Insufficient documentation

## 2014-08-13 DIAGNOSIS — R55 Syncope and collapse: Secondary | ICD-10-CM | POA: Diagnosis present

## 2014-08-13 DIAGNOSIS — R519 Headache, unspecified: Secondary | ICD-10-CM

## 2014-08-13 LAB — CBC WITH DIFFERENTIAL/PLATELET
Basophils Absolute: 0 10*3/uL (ref 0.0–0.1)
Basophils Relative: 0 % (ref 0–1)
Eosinophils Absolute: 0 10*3/uL (ref 0.0–0.7)
Eosinophils Relative: 0 % (ref 0–5)
HCT: 38.6 % (ref 36.0–46.0)
Hemoglobin: 12.9 g/dL (ref 12.0–15.0)
Lymphocytes Relative: 13 % (ref 12–46)
Lymphs Abs: 1.5 10*3/uL (ref 0.7–4.0)
MCH: 22.8 pg — ABNORMAL LOW (ref 26.0–34.0)
MCHC: 33.4 g/dL (ref 30.0–36.0)
MCV: 68.3 fL — ABNORMAL LOW (ref 78.0–100.0)
Monocytes Absolute: 0.3 10*3/uL (ref 0.1–1.0)
Monocytes Relative: 3 % (ref 3–12)
Neutro Abs: 9.6 10*3/uL — ABNORMAL HIGH (ref 1.7–7.7)
Neutrophils Relative %: 84 % — ABNORMAL HIGH (ref 43–77)
Platelets: 252 10*3/uL (ref 150–400)
RBC: 5.65 MIL/uL — ABNORMAL HIGH (ref 3.87–5.11)
RDW: 14.2 % (ref 11.5–15.5)
WBC: 11.4 10*3/uL — ABNORMAL HIGH (ref 4.0–10.5)

## 2014-08-13 MED ORDER — KETOROLAC TROMETHAMINE 15 MG/ML IJ SOLN
15.0000 mg | Freq: Once | INTRAMUSCULAR | Status: AC
  Administered 2014-08-13: 15 mg via INTRAVENOUS
  Filled 2014-08-13: qty 1

## 2014-08-13 MED ORDER — METOCLOPRAMIDE HCL 5 MG/ML IJ SOLN
5.0000 mg | Freq: Once | INTRAMUSCULAR | Status: AC
  Administered 2014-08-13: 5 mg via INTRAVENOUS
  Filled 2014-08-13: qty 2

## 2014-08-13 MED ORDER — SODIUM CHLORIDE 0.9 % IV BOLUS (SEPSIS)
1000.0000 mL | Freq: Once | INTRAVENOUS | Status: AC
  Administered 2014-08-13: 1000 mL via INTRAVENOUS

## 2014-08-13 MED ORDER — DIPHENHYDRAMINE HCL 50 MG/ML IJ SOLN
12.5000 mg | Freq: Once | INTRAMUSCULAR | Status: AC
  Administered 2014-08-13: 12.5 mg via INTRAVENOUS
  Filled 2014-08-13: qty 1

## 2014-08-13 NOTE — ED Notes (Signed)
Pt presents with c/o migraine headache which she has a hx of. Pt also reports that she had a near syncopal episode when she was in the line waiting to pick up her child at school. Pt reports no LOC but simply felt like she was going to pass out. Pt also c/o sinus infection, recently diagnosed with this.

## 2014-08-13 NOTE — ED Provider Notes (Signed)
CSN: [id number]     Arrival date & time 08/13/14  1458 History   First MD Initiated Contact with Patient 08/13/14 1646     Chief Complaint  Patient presents with  . Migraine  . Near Syncope    HPI   39 year old female presents today with headache and dizziness. Patient reports history of sinus infection that she was originally started on Ceftin here 10 days ago, but was switched to Augmentin 3 days ago. She reports continued sinus symptoms, last night she took cold medicine with codeine. She reports that she woke up this morning with dizziness and headache. She reports this headache is unilateral on the right, no radiation of symptoms, with associated photophobia. She reports this is similar to previous migraines, denies changes in vision, smell or taste, focal neurological deficits, chest pain, shortness of breath, heart palpitations, abdominal pain, changes in urine or bladder characteristics or frequency, lower extremity swelling or edema. She reports she's currently on her cycle. Patient reports she is attempted pain relief with Excedrin Migraine with little symptom resolution. Patient reports that she's been dizzy throughout the day, denies syncope.      Past Medical History  Diagnosis Date  . Migraine headache   . Gastroesophageal reflux disease    Past Surgical History  Procedure Laterality Date  . Appendectomy    . Laproscopy    . Ivf    . Breast cyst excision    . Refractive surgery Bilateral   . Sebaceous cyst resection      Scalp   Family History  Problem Relation Age of Onset  . Hypertension Mother   . Hyperlipidemia Mother   . Hypertension Father   . Hyperlipidemia Father   . Stroke Father [id number]  . Diabetes Paternal Aunt   . Heart disease Paternal Uncle [id number]  . Cancer Maternal Grandmother     breast   History  Substance Use Topics  . Smoking status: Never Smoker   . Smokeless tobacco: Not on file  . Alcohol Use: No   OB History    No data available      Review of Systems  All other systems reviewed and are negative.   Allergies  Azithromycin  Home Medications   Prior to Admission medications   Not on File   BP 110/70 mmHg  Pulse 66  Temp(Src) 98.1 F (36.7 C) (Oral)  Resp 20  SpO2 100%  LMP 08/11/2014 (Approximate)    Physical Exam  Constitutional: She is oriented to person, place, and time. She appears well-developed and well-nourished.  HENT:  Head: Normocephalic and atraumatic.  Nose: Right sinus exhibits maxillary sinus tenderness. Left sinus exhibits maxillary sinus tenderness.  Eyes: Pupils are equal, round, and reactive to light.  Neck: Normal range of motion. Neck supple. No JVD present. No tracheal deviation present. No thyromegaly present.  Cardiovascular: Normal rate, regular rhythm, normal heart sounds and intact distal pulses.  Exam reveals no gallop and no friction rub.   No murmur heard. Pulmonary/Chest: Effort normal and breath sounds normal. No stridor. No respiratory distress. She has no wheezes. She has no rales. She exhibits no tenderness.  Musculoskeletal: Normal range of motion.  Lymphadenopathy:    She has no cervical adenopathy.  Neurological: She is alert and oriented to person, place, and time. She has normal strength. She displays no tremor and normal reflexes. No cranial nerve deficit or sensory deficit. She displays a negative Romberg sign. She displays no seizure activity. Coordination normal. GCS eye subscore is  4. GCS verbal subscore is 5. GCS motor subscore is 6.  Skin: Skin is warm and dry.  Psychiatric: She has a normal mood and affect. Her behavior is normal. Judgment and thought content normal.  Nursing note and vitals reviewed.   ED Course  Procedures (including critical care time) Labs Review Labs Reviewed - No data to display  Imaging Review No results found.   EKG Interpretation None      MDM   Final diagnoses:  Headache, unspecified headache type    Labs: CBC-  no significant findings  Imaging: None  Consults: None  Therapeutics: Benadryl, Reglan, Toradol , Normal Saline  Assessment: Headache  Plan: Patient presents with headache and dizziness 1 day. She reports this is similar to previous migraines, unilateral, no fever, neck stiffness, focal neurological deficits. Patient was treated here in the ED, and reports complete resolution of headache dizziness. Patient has no additional complaints today. Patient was given discharge instructions for follow-up with primary care for further evaluation and management of headaches. Patient advised to continue using antibiotics for sinusitis. Advised to continue using her Augmentin as needed for sinusitis, strict return precautions given in the event new worsening signs or symptoms present.      Eyvonne MechanicJeffrey Seaver Machia, PA-C 08/14/14 1704  Purvis SheffieldForrest Harrison, MD 08/14/14 [phone]

## 2014-08-13 NOTE — Discharge Instructions (Signed)
Please contact her primary care provider inform them of your visit today. Please return if new worsening symptoms present.

## 2015-01-21 NOTE — Telephone Encounter (Signed)
Error

## 2015-03-03 ENCOUNTER — Ambulatory Visit (INDEPENDENT_AMBULATORY_CARE_PROVIDER_SITE_OTHER): Payer: PRIVATE HEALTH INSURANCE | Admitting: Family Medicine

## 2015-03-03 ENCOUNTER — Ambulatory Visit (INDEPENDENT_AMBULATORY_CARE_PROVIDER_SITE_OTHER)
Admission: RE | Admit: 2015-03-03 | Discharge: 2015-03-03 | Disposition: A | Discharge status: Home / Self Care | Payer: PRIVATE HEALTH INSURANCE | Source: Ambulatory Visit | Attending: Family Medicine | Admitting: Family Medicine

## 2015-03-03 ENCOUNTER — Encounter: Payer: Self-pay | Admitting: Family Medicine

## 2015-03-03 VITALS — BP 98/70 | HR 77 | Temp 98.0°F | Ht 69.5 in | Wt 178.1 lb

## 2015-03-03 DIAGNOSIS — R7303 Prediabetes: Secondary | ICD-10-CM

## 2015-03-03 DIAGNOSIS — M545 Low back pain, unspecified: Secondary | ICD-10-CM

## 2015-03-03 LAB — POCT URINALYSIS DIPSTICK
Bilirubin, UA: NEGATIVE
Blood, UA: NEGATIVE
Glucose, UA: NEGATIVE
KETONES UA: NEGATIVE
NITRITE UA: NEGATIVE
PH UA: 7
PROTEIN UA: NEGATIVE
Spec Grav, UA: 1.02
Urobilinogen, UA: 0.2

## 2015-03-03 MED ORDER — CYCLOBENZAPRINE HCL 5 MG PO TABS: 5.0000 mg | ORAL_TABLET | Freq: Three times a day (TID) | ORAL | Status: DC | PRN

## 2015-03-03 NOTE — Progress Notes (Signed)
Pre visit review using our clinic review tool, if applicable. No additional management support is needed unless otherwise documented below in the visit note. 

## 2015-03-03 NOTE — Patient Instructions (Signed)
BEFORE YOU LEAVE: -urine dip  -xray sheet -low back exercises -follow up appointment in 1 month  Go get the xrays of your back  Do the exercises 4 days per week  Flexeril nightly for 2 weeks  Follow up with your gynecologist for yearly exam and take copy of CT report  We recommend the following healthy lifestyle measures: - eat a healthy whole foods diet consisting of regular small meals composed of vegetables, fruits, beans, nuts, seeds, healthy meats such as white chicken and fish and whole grains.  - avoid sweets, white starchy foods, fried foods, fast food, processed foods, sodas, red meet and other fattening foods.  - get a least 150-300 minutes of aerobic exercise per week.

## 2015-03-03 NOTE — Addendum Note (Signed)
Addended by: Johnella MoloneyFUNDERBURK, Sharnetta Gielow A on: 03/03/2015 11:51 AM   Modules accepted: Orders

## 2015-03-03 NOTE — Progress Notes (Addendum)
HPI:  Acute visit for:  L low back pain: -started 2-3 months ago -seen in [facility] in Oct and labs, urine and CT scan abd pelvis all ok - no treatment done -still with symptoms -L low back and L side pain, mild, constant, worse with certain activities -has not taken anything as is mild -denies: hx trauma, dysuria, hematuria, severe pain, radiation, weakness, numbness, fevers, malaise, constipation(resolved after colace), SOB, CP -FDLMP 02/14/15, regular normal, seeing gyn soon for exam. -CT report reviewed and given to pt and to scan box (constipation, comment looked like on menses per report)   Prediabetes:  on labs at urgent care No pulyuria, polydipsiea  ROS: See pertinent positives and negatives per HPI.  Past Medical History  Diagnosis Date  . Migraine headache   . Gastroesophageal reflux disease     Past Surgical History  Procedure Laterality Date  . Appendectomy    . Laproscopy    . Ivf    . Breast cyst excision    . Refractive surgery Bilateral   . Sebaceous cyst resection      Scalp    Family History  Problem Relation Age of Onset  . Hypertension Mother   . Hyperlipidemia Mother   . Hypertension Father   . Hyperlipidemia Father   . Stroke Father 53  . Diabetes Paternal Aunt   . Heart disease Paternal Uncle 78  . Cancer Maternal Grandmother     breast    Social History   Social History  . Marital Status: Married    Spouse Name: N/A  . Number of Children: N/A  . Years of Education: N/A   Social History Main Topics  . Smoking status: Never Smoker   . Smokeless tobacco: None  . Alcohol Use: No  . Drug Use: None  . Sexual Activity: Yes   Other Topics Concern  . None   Social History Narrative   Work or School: stays at home      Home Situation: lives with husband and daughter 36 yo (kendall) in 2016      Spiritual Beliefs: Christian      Lifestyle: no regular exercise; diet is ok           Current outpatient  prescriptions:  .  cetirizine (ZYRTEC) 10 MG tablet, Take 10 mg by mouth daily as needed for allergies., Disp: , Rfl:  .  cyclobenzaprine (FLEXERIL) 5 MG tablet, Take 1 tablet (5 mg total) by mouth 3 (three) times daily as needed for muscle spasms., Disp: 20 tablet, Rfl: 1  EXAM:  Filed Vitals:   03/03/15 1048  BP: 98/70  Pulse: 77  Temp: 98 F (36.7 C)    Body mass index is 25.93 kg/(m^2).  GENERAL: vitals reviewed and listed above, alert, oriented, appears well hydrated and in no acute distress  HEENT: atraumatic, conjunttiva clear, no obvious abnormalities on inspection of external nose and ears  NECK: no obvious masses on inspection  LUNGS: clear to auscultation bilaterally, no wheezes, rales or rhonchi, good air movement  CV: HRRR, no peripheral edema  ABD: BS+, soft, NTTP  MS: moves all extremities without noticeable abnormality Normal Gait Normal inspection of back, no obvious scoliosis or leg length descrepancy No bony TTP Soft tissue TTP at: L lumbar paraspinal muscles -/+ tests: neg trendelenburg,-facet loading, -SLRT, -CLRT, -FABER, -FADIR Normal muscle strength, sensation to light touch and DTRs in LEs bilaterally  PSYCH: pleasant and cooperative, no obvious depression or anxiety  ASSESSMENT AND PLAN:  Discussed the following assessment and plan:  Left-sided low back pain without sciatica - Plan: DG Lumbar Spine Complete, cyclobenzaprine (FLEXERIL) 5 MG tablet -we discussed possible serious and likely etiologies with musculoskeletal issue likely, workup and treatment, treatment risks and return precautions -after this discussion, Donzetta StarchYaiko opted for plain films back, muscle relaxer, udip (if blood in urine renal US to look for stone), HEP, follow up with gyn as she is not sure was on period for CT scan - copy report given for her to give to gyn -follow up advised in 1 month -advised assistant to obtain [facility] labs/notes -of course, we advised Donzetta StarchYaiko  to return or  notify a doctor immediately if symptoms worsen or persist or new concerns arise.  Prediabetes -lifestyle recs -advised [facility]  -Patient advised to return or notify a doctor immediately if symptoms worsen or persist or new concerns arise.  Patient Instructions  BEFORE YOU LEAVE: -urine dip  -xray sheet -low back exercises -follow up appointment in 1 month  Go get the xrays of your back  Do the exercises 4 days per week  Flexeril nightly for 2 weeks  Follow up with your gynecologist for yearly exam and take copy of CT report  We recommend the following healthy lifestyle measures: - eat a healthy whole foods diet consisting of regular small meals composed of vegetables, fruits, beans, nuts, seeds, healthy meats such as white chicken and fish and whole grains.  - avoid sweets, white starchy foods, fried foods, fast food, processed foods, sodas, red meet and other fattening foods.  - get a least 150-300 minutes of aerobic exercise per week.       Kriste BasqueKIM, Emmagene Ortner R.

## 2015-03-06 LAB — URINE CULTURE: Colony Count: 10000

## 2015-04-03 ENCOUNTER — Ambulatory Visit: Payer: PRIVATE HEALTH INSURANCE | Admitting: Family Medicine

## 2015-07-22 LAB — HM PAP SMEAR

## 2015-08-13 ENCOUNTER — Encounter: Payer: Self-pay | Admitting: Family Medicine

## 2016-07-08 ENCOUNTER — Emergency Department (HOSPITAL_COMMUNITY): Payer: No Typology Code available for payment source

## 2016-07-08 ENCOUNTER — Encounter (HOSPITAL_COMMUNITY): Payer: Self-pay | Admitting: *Deleted

## 2016-07-08 ENCOUNTER — Emergency Department (HOSPITAL_COMMUNITY)
Admission: EM | Admit: 2016-07-08 | Discharge: 2016-07-08 | Disposition: A | Discharge status: Home / Self Care | Payer: No Typology Code available for payment source | Attending: Physician Assistant | Admitting: Physician Assistant

## 2016-07-08 DIAGNOSIS — R079 Chest pain, unspecified: Secondary | ICD-10-CM | POA: Diagnosis present

## 2016-07-08 DIAGNOSIS — R0789 Other chest pain: Secondary | ICD-10-CM

## 2016-07-08 LAB — BASIC METABOLIC PANEL
Anion gap: 10 (ref 5–15)
BUN: <5 mg/dL — ABNORMAL LOW (ref 6–20)
CO2: 24 mmol/L (ref 22–32)
Calcium: 9.3 mg/dL (ref 8.9–10.3)
Chloride: 105 mmol/L (ref 101–111)
Creatinine, Ser: 0.75 mg/dL (ref 0.44–1.00)
GFR calc Af Amer: >60 mL/min (ref >60)
GFR calc non Af Amer: >60 mL/min (ref >60)
GLUCOSE: 105 mg/dL — AB (ref 65–99)
Potassium: 3.5 mmol/L (ref 3.5–5.1)
Sodium: 139 mmol/L (ref 135–145)

## 2016-07-08 LAB — CBC
HCT: 34.7 % — ABNORMAL LOW (ref 36.0–46.0)
HEMOGLOBIN: 12.1 g/dL (ref 12.0–15.0)
MCH: 23.6 pg — ABNORMAL LOW (ref 26.0–34.0)
MCHC: 34.9 g/dL (ref 30.0–36.0)
MCV: 67.6 fL — AB (ref 78.0–100.0)
Platelets: 213 10*3/uL (ref 150–400)
RBC: 5.13 MIL/uL — ABNORMAL HIGH (ref 3.87–5.11)
RDW: 14.7 % (ref 11.5–15.5)
WBC: 6.2 10*3/uL (ref 4.0–10.5)

## 2016-07-08 LAB — I-STAT TROPONIN, ED
Troponin i, poc: 0 ng/mL (ref 0.00–0.08)
Troponin i, poc: 0 ng/mL (ref 0.00–0.08)

## 2016-07-08 MED ORDER — OMEPRAZOLE 20 MG PO CPDR: 20.0000 mg | DELAYED_RELEASE_CAPSULE | Freq: Every day | ORAL | 0 refills | Status: DC

## 2016-07-08 NOTE — ED Triage Notes (Signed)
Pt c/o mid CP that radiates to L  Neck onset today, denies SOB, n/v/d, denies cardiac history, A&O x4

## 2016-07-08 NOTE — ED Provider Notes (Signed)
[facility]-EMER[facility]ENCY DEPT Provider Note   CSN: [id number] Arrival date & time: 07/08/16  1241     History   Chief Complaint Chief Complaint  Patient presents with  . Chest Pain    HPI Diane Kirk is a 41 y.o. female.  HPI   Pt is a 41 year old female presenting with chest pain that was stabbing in nature this morning at 10:30 AM. Since resolved. Patient that she has pain in bilateral neck as well. Patient feels that the pain in the next is more tightening of her muscles. The chest pain without associated with shortness or diaphoresis. Not associated with exertion. Patient has no hypertension hyperlipidemia or diabetes.  Patient is not on estrogens and has no recent travel, or immobility.     Past Medical History:  Diagnosis Date  . Gastroesophageal reflux disease   . Migraine headache     Patient Active Problem List   Diagnosis Date Noted  . 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 Seasonal allergies 06/09/2014  . Migraine 04/12/2012    Past Surgical History:  Procedure Laterality Date  . APPENDECTOMY    . BREAST CYST EXCISION    . ivf    . laproscopy    . REFRACTIVE SURGERY Bilateral   . sebaceous cyst resection     Scalp    OB History    No data available       Home Medications    Prior to Admission medications   Medication Sig Start Date End Date Taking? Authorizing Provider  cetirizine (ZYRTEC) 10 MG tablet Take 10 mg by mouth daily as needed for allergies.    Historical Provider, MD  cyclobenzaprine (FLEXERIL) 5 MG tablet Take 1 tablet (5 mg total) by mouth 3 (three) times daily as needed for muscle spasms. 03/03/15   Terressa Koyanagi, DO    Family History Family History  Problem Relation Age of Onset  . Hypertension Mother   . Hyperlipidemia Mother   . Hypertension Father   . Hyperlipidemia Father   . Stroke Father 22  . Diabetes Paternal Aunt   . Heart disease Paternal Uncle 77  . Cancer Maternal Grandmother     breast    Social History Social History    Substance Use Topics  . Smoking status: Never Smoker  . Smokeless tobacco: Never Used  . Alcohol use No     Allergies   Azithromycin   Review of Systems Review of Systems  Constitutional: Negative for activity change, fatigue and fever.  Respiratory: Negative for shortness of breath.   Cardiovascular: Positive for chest pain and palpitations.  Gastrointestinal: Negative for abdominal pain.  Neurological: Negative for light-headedness.  All other systems reviewed and are negative.    Physical Exam Updated Vital Signs BP 121/75 (BP Location: Left Arm)   Pulse 75   Temp 98.3 F (36.8 C) (Oral)   Resp 18   Ht  (1.702 m)   Wt 184 lb 4 oz (83.6 kg)   LMP 06/08/2016 (Approximate)   SpO2 100%   BMI 28.86 kg/m   Physical Exam  Constitutional: She is oriented to person, place, and time. She appears well-developed and well-nourished.  HENT:  Head: Normocephalic and atraumatic.  Eyes: Right eye exhibits no discharge. Left eye exhibits no discharge.  Neck: Normal range of motion. Neck supple.  Cardiovascular: Normal rate, regular rhythm and normal heart sounds.   No murmur heard. Pulmonary/Chest: Effort normal and breath sounds normal. She has no wheezes. She has no rales.  Abdominal: Soft.  She exhibits no distension. There is no tenderness.  Neurological: She is oriented to person, place, and time.  Skin: Skin is warm and dry. She is not diaphoretic.  Psychiatric: She has a normal mood and affect.  Nursing note and vitals reviewed.    ED Treatments / Results  Labs (all labs ordered are listed, but only abnormal results are displayed) Labs Reviewed  BASIC METABOLIC PANEL - Abnormal; Notable for the following:       Result Value   Glucose, Bld 105 (*)    BUN <5 (*)    All other components within normal limits  CBC - Abnormal; Notable for the following:    RBC 5.13 (*)    HCT 34.7 (*)    MCV 67.6 (*)    MCH 23.6 (*)    All other components within normal  limits  I-STAT TROPOININ, ED  I-STAT TROPOININ, ED    EKG  EKG Interpretation  Date/Time:  Friday July 08 2016 12:48:37 EDT Ventricular Rate:  76 PR Interval:  154 QRS Duration: 82 QT Interval:  368 QTC Calculation: 414 R Axis:   82 Text Interpretation:  Normal sinus rhythm with sinus arrhythmia Right atrial enlargement Borderline ECG Normal sinus rhythm Confirmed by Kandis Mannan ([id number]) on 07/08/2016 4:40:20 PM       Radiology Dg Chest 2 View  Result Date: 07/08/2016 CLINICAL DATA:  Chest pain EXAM: CHEST  2 VIEW COMPARISON:  None. FINDINGS: The lungs are clear wiithout focal pneumonia, edema, pneumothorax or pleural effusion. The cardiopericardial silhouette is within normal limits for size. The visualized bony structures of the thorax are intact. Nodular density/densities projecting over the lungs are compatible with pads for telemetry leads. IMPRESSION: No active cardiopulmonary disease. Electronically Signed   By: Kennith Center M.D.   On: 07/08/2016 14:05    Procedures Procedures (including critical care time)  Medications Ordered in ED Medications - No data to display   Initial Impression / Assessment and Plan / ED Course  I have reviewed the triage vital signs and the nursing notes.  Pertinent labs & imaging results that were available during my care of the patient were reviewed by me and considered in my medical decision making (see chart for details).     Patient is well-appearing 40 year old female presenting with chest pain. Chest pain sounds atypical, burning. Patient has no cardiac risk factors. We'll do delta troponin. EKG is nonischemic. Patient has no risk factors for PE. She is PERC negative.  Patient's neck pain is likely muscular skeletal. Patient has no meningismus.  Patient's normal labs vital signs. We'll discharge with omeprazole to help with the sense of burning in her chest.   Patient is comfortable, ambulatory, and taking PO at time of  discharge.  Patient expressed understanding about return precautions.   Final Clinical Impressions(s) / ED Diagnoses   Final diagnoses:  None    New Prescriptions New Prescriptions   No medications on file     Courteney Randall An, MD 07/10/16 1105

## 2016-07-08 NOTE — Discharge Instructions (Signed)
Your labs x-ray and vital signs were all reassuring. We think that this symptoms could be related to gastric reflux. Please use the medications prescribed and return with any concerns.

## 2016-12-15 ENCOUNTER — Encounter: Payer: Self-pay | Admitting: Family Medicine

## 2017-01-12 ENCOUNTER — Ambulatory Visit (INDEPENDENT_AMBULATORY_CARE_PROVIDER_SITE_OTHER): Payer: No Typology Code available for payment source | Admitting: Family Medicine

## 2017-01-12 ENCOUNTER — Encounter: Payer: Self-pay | Admitting: Family Medicine

## 2017-01-12 ENCOUNTER — Ambulatory Visit (INDEPENDENT_AMBULATORY_CARE_PROVIDER_SITE_OTHER)
Admission: RE | Admit: 2017-01-12 | Discharge: 2017-01-12 | Disposition: A | Discharge status: Home / Self Care | Payer: No Typology Code available for payment source | Source: Ambulatory Visit | Attending: Family Medicine | Admitting: Family Medicine

## 2017-01-12 VITALS — BP 100/78 | HR 68 | Temp 98.2°F | Ht 67.0 in | Wt 178.3 lb

## 2017-01-12 DIAGNOSIS — R197 Diarrhea, unspecified: Secondary | ICD-10-CM

## 2017-01-12 DIAGNOSIS — M545 Low back pain, unspecified: Secondary | ICD-10-CM

## 2017-01-12 DIAGNOSIS — K219 Gastro-esophageal reflux disease without esophagitis: Secondary | ICD-10-CM

## 2017-01-12 NOTE — Patient Instructions (Signed)
BEFORE YOU LEAVE: -low back exercises -xray sheet -follow up: 2-4 weeks (30 minutes if she wants OMT)  For the back pain: -Go get the x-rays -Do the exercises provided 4 days per week -Use heat, topical menthol ( Tiger balm is a good product) and Aleve as needed  For the diarrhea and nausea: -This seems to be clearing up -Drink plenty of fluids -Avoid dairy products for 1 month - take her acid reflux medicine every day for at least 2 weeks -Consider taking a probiotic such as align or culture result once daily for 1-2 months

## 2017-01-12 NOTE — Progress Notes (Signed)
HPI:  Acute visit for several issues:  Low back pain: -x 1-2 months -Intermittent discomfort in the left low back -This will last for a day or 2, then resolve on its own and -Can't think of injury or aggravating factors, ibuprofen helps some -Denies radiation, fevers, malaise, unexplained weight loss, hematuria, weakness or numbness -She has seen a chiropractor for this, but it has not helped much -denies prior history with back issues -She has a remote history of left-sided low back pain in the pas ton review of chart  Acute diarrhea with nausea: -Started 2 days ago and now mostly resolved -She had several episodes of watery diarrhea and nausea, now doing better today -Denies fevers,  Vomiting, melena, hematochezia, inability to tolerate oral intake of fluids, abdominal pain -Reports she occasionally has acid reflux and occasionally has mucus in her stools chronically for the last few months -She has a history of acid reflux and is not taking her acid medicine currently  ROS: See pertinent positives and negatives per HPI.  Past Medical History:  Diagnosis Date  . Gastroesophageal reflux disease   . Migraine headache     Past Surgical History:  Procedure Laterality Date  . APPENDECTOMY    . BREAST CYST EXCISION    . ivf    . laproscopy    . REFRACTIVE SURGERY Bilateral   . sebaceous cyst resection     Scalp    Family History  Problem Relation Age of Onset  . Hypertension Mother   . Hyperlipidemia Mother   . Hypertension Father   . Hyperlipidemia Father   . Stroke Father [id number]  . Diabetes Paternal Aunt   . Heart disease Paternal Uncle [id number]  . Cancer Maternal Grandmother        breast    Social History   Social History  . Marital status: Married    Spouse name: N/A  . Number of children: N/A  . Years of education: N/A   Social History Main Topics  . Smoking status: Never Smoker  . Smokeless tobacco: Never Used  . Alcohol use No  . Drug use: No  . Sexual  activity: Yes   Other Topics Concern  . None   Social History Narrative   Work or School: stays at home      Home Situation: lives with husband and daughter 445 yo (kendall) in 2016      Spiritual Beliefs: Christian      Lifestyle: no regular exercise; diet is ok           Current Outpatient Prescriptions:  .  aspirin-acetaminophen-caffeine (EXCEDRIN MIGRAINE) 250-250-65 MG tablet, Take 1 tablet by mouth every 6 (six) hours as needed for headache., Disp: , Rfl:  .  cetirizine (ZYRTEC) 10 MG tablet, Take 10 mg by mouth daily as needed for allergies., Disp: , Rfl:  .  cyclobenzaprine (FLEXERIL) 5 MG tablet, Take 1 tablet (5 mg total) by mouth 3 (three) times daily as needed for muscle spasms., Disp: 20 tablet, Rfl: 1 .  omeprazole (PRILOSEC) 20 MG capsule, Take 1 capsule (20 mg total) by mouth daily., Disp: 30 capsule, Rfl: 0 .  Pseudoephedrine-Ibuprofen (ADVIL COLD/SINUS) 30-200 MG TABS, Take 1 tablet by mouth every 6 (six) hours as needed (for cold symptoms or nasal congestion)., Disp: , Rfl:   EXAM:  Vitals:   01/12/17 1055  BP: 100/78  Pulse: 68  Temp: 98.2 F (36.8 C)    Body mass index is 27.93 kg/m.  GENERAL: vitals reviewed  and listed above, alert, oriented, appears well hydrated and in no acute distress  HEENT: atraumatic, conjunttiva clear, no obvious abnormalities on inspection of external nose and ears  NECK: no obvious masses on inspection  LUNGS: clear to auscultation bilaterally, no wheezes, rales or rhonchi, good air movement  CV: HRRR, no peripheral edema  ABD: bowel sounds positive all 4 quadrants, soft, nontender to palpation, no CVA tenderness to palpation  MS: moves all extremities without noticeable abnormality, fairly normal inspection of the back "back hips and lower extremities, normal functioning of the lower extremities bilaterally, L4 is extended rotated left side bent left, she has a posterior L4 tender point on the left  PSYCH: pleasant  and cooperative, no obvious depression or anxiety  ASSESSMENT AND PLAN:  Discussed the following assessment and plan:  Left-sided low back pain without sciatica, unspecified chronicity - Plan: DG Lumbar Spine Complete -suspect musculoskeletal etiology, discussed other potential causes of low back pain -Opted to do plain films today given this is going on for 1-2 months -She wanted to try an osteopathic treatment for the low back pain today, see below, she felt better after the treatment -Home exercises, symptomatic conservative care  and core support him follow-up in 2-4 weeks advised  Diarrhea, unspecified type -likely acute viral gastroenteritis, seems to be resolving -Advised probiotic, and no dairy and follow up in 2-4 weeks  Gastroesophageal reflux disease, esophagitis presence not specified -PPI for 1-2 weeks  PROCEDURE NOTE : OSTEOPATHIC TREATMENT The decision today to treat with gentle Osteopathic Manipulative Therapy  (OMT) was based on physical exam findings. Verbal consent was  obtained after after explanation of risks and benefits. No Cervical HVLA manipulation was performed. After consent was obtained, treatment was  performed as below:      Regions treated: lumbar     Techniques used: counterstrain The patient tolerated the treatment well and reported Improved  symptoms following treatment today. Follow up treatment was advised in: 1-2 weeks   -Patient advised to return or notify a doctor immediately if symptoms worsen or persist or new concerns arise.  Patient Instructions  BEFORE YOU LEAVE: -low back exercises -xray sheet -follow up: 2-4 weeks (30 minutes if she wants OMT)  For the back pain: -Go get the x-rays -Do the exercises provided 4 days per week -Use heat, topical menthol ( Tiger balm is a good product) and Aleve as needed  For the diarrhea and nausea: -This seems to be clearing up -Drink plenty of fluids -Avoid dairy products for 1  month - take her acid reflux medicine every day for at least 2 weeks -Consider taking a probiotic such as align or culture result once daily for 1-2 months   KIM, Dahlia Client R., DO

## 2017-01-26 ENCOUNTER — Ambulatory Visit: Payer: No Typology Code available for payment source | Admitting: Family Medicine

## 2017-01-26 ENCOUNTER — Encounter: Payer: Self-pay | Admitting: Family Medicine

## 2017-01-26 ENCOUNTER — Ambulatory Visit (INDEPENDENT_AMBULATORY_CARE_PROVIDER_SITE_OTHER): Payer: No Typology Code available for payment source | Admitting: Family Medicine

## 2017-01-26 VITALS — BP 112/80 | HR 82 | Temp 98.3°F | Ht 67.0 in | Wt 170.5 lb

## 2017-01-26 DIAGNOSIS — H8112 Benign paroxysmal vertigo, left ear: Secondary | ICD-10-CM | POA: Diagnosis not present

## 2017-01-26 DIAGNOSIS — R194 Change in bowel habit: Secondary | ICD-10-CM | POA: Diagnosis not present

## 2017-01-26 DIAGNOSIS — R110 Nausea: Secondary | ICD-10-CM

## 2017-01-26 DIAGNOSIS — R0989 Other specified symptoms and signs involving the circulatory and respiratory systems: Secondary | ICD-10-CM

## 2017-01-26 MED ORDER — MECLIZINE HCL 25 MG PO TABS: 25.0000 mg | ORAL_TABLET | Freq: Three times a day (TID) | ORAL | 0 refills | Status: DC | PRN

## 2017-01-26 NOTE — Progress Notes (Signed)
HPI:  Acute visit for several issues:  Ongoing bowel issues: -She feels like her stools up and a little different for the last few months, sometimes loose, sometimes she thinks they have mucus in them -Occasional nausea -No weakness, weight loss, fevers, hematochezia, melena, vomiting, further diarrhea  Vertigo: -The last few days, started with turning to get out of bed -Sudden brief spells of nausea and vertigo triggered by specific movements -No fever, headache, hearing loss, vision changes, weakness, numbness  URI: -Mild sore throat, sinus pressure, postnasal drip, occasional crackling in the left ear, mild aching muscles -No fever, shortness of breath, rash   ROS: See pertinent positives and negatives per HPI.  Past Medical History:  Diagnosis Date  . Gastroesophageal reflux disease   . Migraine headache     Past Surgical History:  Procedure Laterality Date  . APPENDECTOMY    . BREAST CYST EXCISION    . ivf    . laproscopy    . REFRACTIVE SURGERY Bilateral   . sebaceous cyst resection     Scalp    Family History  Problem Relation Age of Onset  . Hypertension Mother   . Hyperlipidemia Mother   . Hypertension Father   . Hyperlipidemia Father   . Stroke Father [id number]  . Diabetes Paternal Aunt   . Heart disease Paternal Uncle [phone]  . Cancer Maternal Grandmother        breast    Social History   Social History  . Marital status: Married    Spouse name: N/A  . Number of children: N/A  . Years of education: N/A   Social History Main Topics  . Smoking status: Never Smoker  . Smokeless tobacco: Never Used  . Alcohol use No  . Drug use: No  . Sexual activity: Yes   Other Topics Concern  . None   Social History Narrative   Work or School: stays at home      Home Situation: lives with husband and daughter 625 yo (kendall) in 2016      Spiritual Beliefs: Christian      Lifestyle: no regular exercise; diet is ok           Current Outpatient  Prescriptions:  .  aspirin-acetaminophen-caffeine (EXCEDRIN MIGRAINE) 250-250-65 MG tablet, Take 1 tablet by mouth every 6 (six) hours as needed for headache., Disp: , Rfl:  .  cetirizine (ZYRTEC) 10 MG tablet, Take 10 mg by mouth daily as needed for allergies., Disp: , Rfl:  .  cyclobenzaprine (FLEXERIL) 5 MG tablet, Take 1 tablet (5 mg total) by mouth 3 (three) times daily as needed for muscle spasms., Disp: 20 tablet, Rfl: 1 .  omeprazole (PRILOSEC) 20 MG capsule, Take 1 capsule (20 mg total) by mouth daily., Disp: 30 capsule, Rfl: 0 .  Pseudoephedrine-Ibuprofen (ADVIL COLD/SINUS) 30-200 MG TABS, Take 1 tablet by mouth every 6 (six) hours as needed (for cold symptoms or nasal congestion)., Disp: , Rfl:  .  meclizine (MEDI-MECLIZINE) 25 MG tablet, Take 1 tablet (25 mg total) by mouth 3 (three) times daily as needed for dizziness., Disp: 30 tablet, Rfl: 0  EXAM:  Vitals:   01/26/17 1512  BP: 112/80  Pulse: 82  Temp: 98.3 F (36.8 C)    Body mass index is 26.7 kg/m.  GENERAL: vitals reviewed and listed above, alert, oriented, appears well hydrated and in no acute distress  HEENT: atraumatic, conjunttiva clear, no obvious abnormalities on inspection of external nose and ears, normal appearance of ear  canals and TMs except for clear effusion on the left, clear nasal congestion, mild post oropharyngeal erythema with PND, no tonsillar edema or exudate, no sinus TTP  NECK: no obvious masses on inspection  LUNGS: clear to auscultation bilaterally, no wheezes, rales or rhonchi, good air movement  CV: HRRR, no peripheral edema  ABD: bowel sounds positive, soft, nontender to palpation  MS: moves all extremities without noticeable abnormality  PSYCH: pleasant and cooperative, no obvious depression or anxiety, cranial nerves II through XII grossly intact, finger to nose normal, speech and thought processing grossly intact, Gilberto Better is positive to the right  ASSESSMENT AND  PLAN:  Discussed the following assessment and plan:  Benign paroxysmal positional vertigo of left ear - Plan: Ambulatory referral to Physical Therapy -discussed home exercises, she prefers formal therapy, referral placed -Meclizine for symptoms as needed -No driving -Return precautions  Nausea -currently may be linked to her vertigo -We'll get some labs per below  Bowel habit changes - Plan: CBC with Differential/Platelet, Celiac Disease Comprehensive Panel with Reflexes, Comprehensive metabolic panel, Helicobacter pylori special antigen  Upper respiratory symptom -seems likely she has a viral upper respiratory infection or allergies right now -Symptomatic care and follow-up persist or worsens  -Patient advised to return or notify a doctor immediately if symptoms worsen or persist or new concerns arise.  Patient Instructions  BEFORE YOU LEAVE: -follow up: Physical exam in 1 month (come fasting if possible) -labs  -We placed a referral for you as discussed for physical therapy. It usually takes about 1-2 weeks to process and schedule this referral. If you have not heard from [place] regarding this appointment in 2 weeks please contact our office.  Do not drive with vertigo  We have ordered labs or studies at this visit. It can take up to 1-2 weeks for results and processing. IF results require follow up or explanation, we will call you with instructions. Clinically stable results will be released to your [facility]. If you have not heard from [place] or cannot find your results in [facility] in 2 weeks please contact our office at [phone].  If you are not yet signed up for [facility], please consider signing up.            Kriste Basque R., DO

## 2017-01-26 NOTE — Patient Instructions (Addendum)
BEFORE YOU LEAVE: -follow up: Physical exam in 1 month (come fasting if possible) -labs  -We placed a referral for you as discussed for physical therapy. It usually takes about 1-2 weeks to process and schedule this referral. If you have not heard from us regarding this appointment in 2 weeks please contact our office.  Do not drive with vertigo  We have ordered labs or studies at this visit. It can take up to 1-2 weeks for results and processing. IF results require follow up or explanation, we will call you with instructions. Clinically stable results will be [facility] to [facility]. If you have not heard from us or cannot find your results in [facility] in 2 weeks please contact our office at [phone].  If you are not yet signed up for [facility], please consider signing up.

## 2017-01-27 LAB — COMPREHENSIVE METABOLIC PANEL
ALT: 6 U/L (ref 0–35)
AST: 12 U/L (ref 0–37)
Albumin: 4.7 g/dL (ref 3.5–5.2)
Alkaline Phosphatase: 52 U/L (ref 39–117)
BUN: 9 mg/dL (ref 6–23)
CO2: 26 mEq/L (ref 19–32)
CREATININE: 0.76 mg/dL (ref 0.40–1.20)
Calcium: 9.8 mg/dL (ref 8.4–10.5)
Chloride: 103 mEq/L (ref 96–112)
GFR: 89.61 mL/min (ref >60.00)
GLUCOSE: 86 mg/dL (ref 70–99)
POTASSIUM: 4.1 meq/L (ref 3.5–5.1)
SODIUM: 137 meq/L (ref 135–145)
Total Bilirubin: 1.5 mg/dL — ABNORMAL HIGH (ref 0.2–1.2)
Total Protein: 7.8 g/dL (ref 6.0–8.3)

## 2017-01-27 LAB — CBC WITH DIFFERENTIAL/PLATELET
BASOS ABS: 0 10*3/uL (ref 0.0–0.1)
BASOS PCT: 0.5 % (ref 0.0–3.0)
EOS ABS: 0 10*3/uL (ref 0.0–0.7)
EOS PCT: 0.4 % (ref 0.0–5.0)
HEMATOCRIT: 39.6 % (ref 36.0–46.0)
HEMOGLOBIN: 12.6 g/dL (ref 12.0–15.0)
Lymphocytes Relative: 31.7 % (ref 12.0–46.0)
Lymphs Abs: 2.3 10*3/uL (ref 0.7–4.0)
MCHC: 31.7 g/dL (ref 30.0–36.0)
MCV: 72.7 fl — ABNORMAL LOW (ref 78.0–100.0)
Monocytes Absolute: 0.5 10*3/uL (ref 0.1–1.0)
Monocytes Relative: 6.2 % (ref 3.0–12.0)
NEUTROS ABS: 4.5 10*3/uL (ref 1.4–7.7)
NEUTROS PCT: 61.2 % (ref 43.0–77.0)
Platelets: 218 10*3/uL (ref 150.0–400.0)
RBC: 5.45 Mil/uL — ABNORMAL HIGH (ref 3.87–5.11)
RDW: 14.6 % (ref 11.5–15.5)
WBC: 7.4 10*3/uL (ref 4.0–10.5)

## 2017-01-27 LAB — CELIAC DISEASE COMPREHENSIVE PANEL WITH REFLEXES
(tTG) Ab, IgA: 1 U/mL
IMMUNOGLOBULIN A: 185 mg/dL (ref 81–463)

## 2017-01-29 ENCOUNTER — Encounter: Payer: Self-pay | Admitting: Family Medicine

## 2017-01-30 ENCOUNTER — Ambulatory Visit: Payer: No Typology Code available for payment source | Admitting: Family Medicine

## 2017-01-30 ENCOUNTER — Encounter: Payer: Self-pay | Admitting: Family Medicine

## 2017-01-30 VITALS — BP 98/78 | HR 88 | Temp 98.2°F | Ht 67.0 in | Wt 172.3 lb

## 2017-01-30 DIAGNOSIS — R110 Nausea: Secondary | ICD-10-CM | POA: Diagnosis not present

## 2017-01-30 DIAGNOSIS — R42 Dizziness and giddiness: Secondary | ICD-10-CM | POA: Diagnosis not present

## 2017-01-30 DIAGNOSIS — R945 Abnormal results of liver function studies: Secondary | ICD-10-CM | POA: Diagnosis not present

## 2017-01-30 DIAGNOSIS — R7989 Other specified abnormal findings of blood chemistry: Secondary | ICD-10-CM

## 2017-01-30 NOTE — Progress Notes (Signed)
HPI:  Diane Kirk is a pleasant 41 year old with a history of GERD and migraines who recently was experiencing some vertigo and some mild intestinal symptoms. We did a number of labs and she unfortunately check these online before speaking to my nurse. She then went to [employer] doc spent the entire night worrying that she has cancer. She had a mildly elevated bilirubin level. All of her other liver enzymes were normal. She has an appointment with the gastroenterologist tomorrow about this, but she made an urgent appointment with me today. She also wants to do the H. pylori test that we had ordered at her visit. Apparently the lab did not give her a collection cup for the stool sample.  She reports her vertigo has improved, but she did not hear back from the physical therapy office about an appointment. She still has some brief spells of vertigo and would like to do vestibular rehabilitation.  ROS: See pertinent positives and negatives per HPI.  Past Medical History:  Diagnosis Date  . Gastroesophageal reflux disease   . Migraine headache     Past Surgical History:  Procedure Laterality Date  . APPENDECTOMY    . BREAST CYST EXCISION    . ivf    . laproscopy    . REFRACTIVE SURGERY Bilateral   . sebaceous cyst resection     Scalp    Family History  Problem Relation Age of Onset  . Hypertension Mother   . Hyperlipidemia Mother   . Hypertension Father   . Hyperlipidemia Father   . Stroke Father 75  . Diabetes Paternal Aunt   . Heart disease Paternal Uncle 16  . Cancer Maternal Grandmother        breast    Social History   Socioeconomic History  . Marital status: Married    Spouse name: None  . Number of children: None  . Years of education: None  . Highest education level: None  Social Needs  . Financial resource strain: None  . Food insecurity - worry: None  . Food insecurity - inability: None  . Transportation needs - medical: None  . Transportation needs -  non-medical: None  Occupational History  . None  Tobacco Use  . Smoking status: Never Smoker  . Smokeless tobacco: Never Used  Substance and Sexual Activity  . Alcohol use: No  . Drug use: No  . Sexual activity: Yes  Other Topics Concern  . None  Social History Narrative   Work or School: stays at home      Home Situation: lives with husband and daughter 1 yo (kendall) in 2016      Spiritual Beliefs: Christian      Lifestyle: no regular exercise; diet is ok        Current Outpatient Medications:  .  aspirin-acetaminophen-caffeine (EXCEDRIN MIGRAINE) 250-250-65 MG tablet, Take 1 tablet by mouth every 6 (six) hours as needed for headache., Disp: , Rfl:  .  cetirizine (ZYRTEC) 10 MG tablet, Take 10 mg by mouth daily as needed for allergies., Disp: , Rfl:  .  cyclobenzaprine (FLEXERIL) 5 MG tablet, Take 1 tablet (5 mg total) by mouth 3 (three) times daily as needed for muscle spasms., Disp: 20 tablet, Rfl: 1 .  meclizine (MEDI-MECLIZINE) 25 MG tablet, Take 1 tablet (25 mg total) by mouth 3 (three) times daily as needed for dizziness., Disp: 30 tablet, Rfl: 0 .  omeprazole (PRILOSEC) 20 MG capsule, Take 1 capsule (20 mg total) by mouth daily., Disp: 30  capsule, Rfl: 0 .  Pseudoephedrine-Ibuprofen (ADVIL COLD/SINUS) 30-200 MG TABS, Take 1 tablet by mouth every 6 (six) hours as needed (for cold symptoms or nasal congestion)., Disp: , Rfl:   EXAM:  Vitals:   01/30/17 1021  BP: 98/78  Pulse: 88  Temp: 98.2 F (36.8 C)    Body mass index is 26.99 kg/m.  GENERAL: vitals reviewed and listed above, alert, oriented, appears well hydrated and in no acute distress  HEENT: atraumatic, conjunttiva clear, no obvious abnormalities on inspection of external nose and ears  NECK: no obvious masses on inspection  LUNGS: clear to auscultation bilaterally, no wheezes, rales or rhonchi, good air movement  CV: HRRR, no peripheral edema  MS: moves all extremities without noticeable  abnormality  PSYCH: pleasant and cooperative, no obvious depression or anxiety  ASSESSMENT AND PLAN:  Discussed the following assessment and plan:  Abnormal liver function test -Discussed her results, advised checking direct and indirect bili - she opted to wait until she sees a gastroenterologist and discuss this with them and do further testing with them. She has an appointment tomorrow. She is extremely anxious about this after reading on line about causes of elevated bilirubin.  Nausea -Seeing GI tomorrow -Assistant to check on the H. pylori test, she wants to do that here -Could also be related to the vertigo  Vertigo -Likely BPPV, referred for vestibular rehabilitation, will have my assistant check on this referral  -Patient advised to return or notify a doctor immediately if symptoms worsen or persist or new concerns arise.  Patient Instructions  BEFORE YOU LEAVE: -she wants us to order h. Pylori stool test - ordered last visit, can you check why not done and collect sample? -referred for PT for vertigo - can you check on status of referral and update patient -follow up: cancel any follow up and schedule follow up in 1 month  See the gastroenterologist as planned tomorrow. You can follow up on the bilirubin with them per your preference.  Get the physical therapy for vertigo. Let me know if not set up for this in the next 1 week.  H Pylori test.    Kriste BasqueKIM, Kaiyon Hynes R., DO

## 2017-01-30 NOTE — Patient Instructions (Signed)
BEFORE YOU LEAVE: -she wants us to order h. Pylori stool test - ordered last visit, can you check why not done and collect sample? -referred for PT for vertigo - can you check on status of referral and update patient -follow up: cancel any follow up and schedule follow up in 1 month  See the gastroenterologist as planned tomorrow. You can follow up on the bilirubin with them per your preference.  Get the physical therapy for vertigo. Let me know if not set up for this in the next 1 week.  H Pylori test.

## 2017-01-31 ENCOUNTER — Encounter: Payer: Self-pay | Admitting: Gastroenterology

## 2017-01-31 ENCOUNTER — Ambulatory Visit (INDEPENDENT_AMBULATORY_CARE_PROVIDER_SITE_OTHER): Payer: No Typology Code available for payment source | Admitting: Gastroenterology

## 2017-01-31 ENCOUNTER — Other Ambulatory Visit (INDEPENDENT_AMBULATORY_CARE_PROVIDER_SITE_OTHER): Payer: No Typology Code available for payment source

## 2017-01-31 VITALS — BP 100/70 | HR 84 | Ht 69.0 in | Wt 171.2 lb

## 2017-01-31 DIAGNOSIS — R7989 Other specified abnormal findings of blood chemistry: Secondary | ICD-10-CM

## 2017-01-31 DIAGNOSIS — R945 Abnormal results of liver function studies: Secondary | ICD-10-CM

## 2017-01-31 DIAGNOSIS — R1031 Right lower quadrant pain: Secondary | ICD-10-CM | POA: Diagnosis not present

## 2017-01-31 DIAGNOSIS — R195 Other fecal abnormalities: Secondary | ICD-10-CM | POA: Diagnosis not present

## 2017-01-31 LAB — CBC WITH DIFFERENTIAL/PLATELET
BASOS PCT: 0.4 % (ref 0.0–3.0)
Basophils Absolute: 0 10*3/uL (ref 0.0–0.1)
EOS ABS: 0 10*3/uL (ref 0.0–0.7)
EOS PCT: 0.3 % (ref 0.0–5.0)
HEMATOCRIT: 37.9 % (ref 36.0–46.0)
HEMOGLOBIN: 12.3 g/dL (ref 12.0–15.0)
LYMPHS PCT: 22.5 % (ref 12.0–46.0)
Lymphs Abs: 1.6 10*3/uL (ref 0.7–4.0)
MCHC: 32.5 g/dL (ref 30.0–36.0)
MCV: 71.7 fl — ABNORMAL LOW (ref 78.0–100.0)
Monocytes Absolute: 0.4 10*3/uL (ref 0.1–1.0)
Monocytes Relative: 5.1 % (ref 3.0–12.0)
Neutro Abs: 4.9 10*3/uL (ref 1.4–7.7)
Neutrophils Relative %: 71.7 % (ref 43.0–77.0)
Platelets: 209 10*3/uL (ref 150.0–400.0)
RBC: 5.29 Mil/uL — AB (ref 3.87–5.11)
RDW: 14.5 % (ref 11.5–15.5)
WBC: 6.9 10*3/uL (ref 4.0–10.5)

## 2017-01-31 LAB — HELICOBACTER PYLORI  SPECIAL ANTIGEN: MICRO NUMBER: 81239573

## 2017-01-31 LAB — PROTIME-INR
INR: 1.3 ratio — ABNORMAL HIGH (ref 0.8–1.0)
PROTHROMBIN TIME: 13.8 s — AB (ref 9.6–13.1)

## 2017-01-31 LAB — IBC PANEL
IRON: 137 ug/dL (ref 42–145)
Saturation Ratios: 44.3 % (ref 20.0–50.0)
Transferrin: 221 mg/dL (ref 212.0–360.0)

## 2017-01-31 LAB — HEPATIC FUNCTION PANEL
ALBUMIN: 4.4 g/dL (ref 3.5–5.2)
ALT: 7 U/L (ref 0–35)
AST: 12 U/L (ref 0–37)
Alkaline Phosphatase: 51 U/L (ref 39–117)
BILIRUBIN TOTAL: 1.1 mg/dL (ref 0.2–1.2)
Bilirubin, Direct: 0.2 mg/dL (ref 0.0–0.3)
Total Protein: 7.9 g/dL (ref 6.0–8.3)

## 2017-01-31 LAB — FERRITIN: Ferritin: 29.9 ng/mL (ref 10.0–291.0)

## 2017-01-31 NOTE — Patient Instructions (Addendum)
You will be set up for an ultrasound for abnormal liver tests.  [facility] have [facility] on 02/03/17 at 1030 am. Please arrive 15 minutes prior to your appointment for registration. Make certain not to have anything to eat or drink 6 hours prior to your appointment. Should you need to reschedule your appointment, please contact radiology at [phone]. This test typically takes about 30 minutes to perform.  You will get labs drawn today:  Hepatitis A (IgM and IgG), Hepatitis B surface antigen, Hepatitis B surface antibody, Hepatitis C antibody, total iron, ferritin, TIBC, ANA, AMA, anti smooth muscle antibody, alpha 1 antitrypsin, cerulloplasm, CBC, hepatic function panel (not cmet), INR.  Also stool testing for ova/parasites, c. Diff by toxin and by PCR, fecal leukocytes, routine stool culture.  Your isolated elevated total bilirubin is unlikely from anything serious.  It is possibly: Gilbert's syndrome (defect in bilirubin metabolism that is common and completely benign).  Normal BMI (Body Mass Index- based on height and weight) is between 19 and 25. Your BMI today is Body mass index is 25.29 kg/m. Marland Kitchen. Please consider follow up  regarding your BMI with your Primary Care Provider.

## 2017-01-31 NOTE — Addendum Note (Signed)
Addended by: Brandt LoosenAYLOR, Mekesha Solomon L on: 01/31/2017 09:09 AM   Modules accepted: Orders

## 2017-01-31 NOTE — Progress Notes (Signed)
HPI: This is a  Very pleasant 41 yo womabn  who was referred to me by Terressa KoyanagiKim, Hannah R, DO  to evaluate  Elevated   Liver tests, change in bowels, abdominal discomforts  Chief complaint is elevated isolated total bilirubin, change in bowel habits   Urine preg test this AM was positive.  This would be her second child. She had an ectopic pregnancy this past summer and so she is hopeful but a bit uncertain about how this will progress  Father had cancer, maybe started in liver.  Also involved colon and pancreatitis.  She has dermatographia.  Overall weight down 7 pounds in a month.  For the past 3 or 4 weeks she has had a change in her bowel habits. She has some days of normal stools and about half of the time she will have postprandial cramping followed by loose stools. Very mild nausea. No overt GI bleeding. On the days when she has loose stools will happen several times. She did not have these chronic problems with her first pregnancy. She believes she is probably 3-[redacted] weeks pregnant.  Was getting headaches, taking excedrine and ibuprofen; probably taking multiple of each for about a week.   she gets periodic sinus infections and takes antibiotics for those. She does believe she was on antibiotics within the last 3 or 4 months.  Review of systems: Pertinent positive and negative review of systems were noted in the above HPI section. All other review negative.   Past Medical History:  Diagnosis Date  . Anxiety   . Gastroesophageal reflux disease   . Migraine headache     Past Surgical History:  Procedure Laterality Date  . APPENDECTOMY    . BREAST CYST EXCISION    . ivf    . laproscopy    . REFRACTIVE SURGERY Bilateral   . sebaceous cyst resection     Scalp    Current Outpatient Medications  Medication Sig Dispense Refill  . aspirin-acetaminophen-caffeine (EXCEDRIN MIGRAINE) 250-250-65 MG tablet Take 1 tablet by mouth every 6 (six) hours as needed for headache.    .  cetirizine (ZYRTEC) 10 MG tablet Take 10 mg by mouth daily as needed for allergies.    Marland Kitchen. meclizine (MEDI-MECLIZINE) 25 MG tablet Take 1 tablet (25 mg total) by mouth 3 (three) times daily as needed for dizziness. 30 tablet 0  . omeprazole (PRILOSEC) 20 MG capsule Take 1 capsule (20 mg total) by mouth daily. 30 capsule 0  . Pseudoephedrine-Ibuprofen (ADVIL COLD/SINUS) 30-200 MG TABS Take 1 tablet by mouth every 6 (six) hours as needed (for cold symptoms or nasal congestion).     No current facility-administered medications for this visit.     Allergies as of 01/31/2017 - Review Complete 01/31/2017  Allergen Reaction Noted  . Azithromycin Other (See Comments) 04/12/2012  . Tape Rash 07/08/2016    Family History  Problem Relation Age of Onset  . Hypertension Mother   . Hyperlipidemia Mother   . Hypertension Father   . Hyperlipidemia Father   . Stroke Father [id number]  . Diabetes Paternal Aunt   . Heart disease Paternal Uncle [id number]  . Cancer Maternal Grandmother        breast    Social History   Socioeconomic History  . Marital status: Married    Spouse name: Not on file  . Number of children: Not on file  . Years of education: Not on file  . Highest education level: Not on file  Social Needs  .  Financial resource strain: Not on file  . Food insecurity - worry: Not on file  . Food insecurity - inability: Not on file  . Transportation needs - medical: Not on file  . Transportation needs - non-medical: Not on file  Occupational History  . Not on file  Tobacco Use  . Smoking status: Never Smoker  . Smokeless tobacco: Never Used  Substance and Sexual Activity  . Alcohol use: No  . Drug use: No  . Sexual activity: Yes  Other Topics Concern  . Not on file  Social History Narrative   Work or School: stays at home      Home Situation: lives with husband and daughter 395 yo (kendall) in 2016      Spiritual Beliefs: Christian      Lifestyle: no regular exercise; diet is ok         Physical Exam: Ht 5\' 9"  (1.753 m) Comment: height measured without shoes  Wt 171 lb 4 oz (77.7 kg)   LMP 01/05/2017 (Exact Date)   BMI 25.29 kg/m  Constitutional: generally well-appearing Psychiatric: alert and oriented x3 Eyes: extraocular movements intact Mouth: oral pharynx moist, no lesions Neck: supple no lymphadenopathy Cardiovascular: heart regular rate and rhythm Lungs: clear to auscultation bilaterally Abdomen: soft, nontender, nondistended, no obvious ascites, no peritoneal signs, normal bowel sounds Extremities: no lower extremity edema bilaterally Skin: no lesions on visible extremities   Assessment and plan: 41 y.o. female with  Early pregnancy, change in bowel habits, isolated elevated total bilirubin  First her change in bowel habits may simply be because she is pregnant.  She has been on antibiotics intermittently, possibly in the last 3 or 4 months from what she can remember. This brings up the possibility of Clostridium difficile. I'm going to check her stool for fat and also routine stool culture, ova parasites, fecal leukocytes. As far as her isolated elevated total bilirubin this might be simply from gilberts syndrome, possibly reaction to pregnancy.  I explained to her it is extremely unlikely this is a presentation of cancer which she was worried about since her father had cancer in his liver. I recommended that ultrasound of her liver as well as a battery of blood tests to check for other potential causes of elevated bilirubin. See that list in patient instructions.    Please see the "Patient Instructions" section for addition details about the plan.   Rob Buntinganiel Resa Rinks, MD  Gastroenterology 01/31/2017, 8:26 AM  Cc: Terressa KoyanagiKim, Hannah R, DO

## 2017-02-02 ENCOUNTER — Other Ambulatory Visit: Payer: No Typology Code available for payment source

## 2017-02-02 DIAGNOSIS — R7989 Other specified abnormal findings of blood chemistry: Secondary | ICD-10-CM

## 2017-02-02 DIAGNOSIS — R195 Other fecal abnormalities: Secondary | ICD-10-CM

## 2017-02-02 DIAGNOSIS — R1031 Right lower quadrant pain: Secondary | ICD-10-CM

## 2017-02-02 DIAGNOSIS — R945 Abnormal results of liver function studies: Principal | ICD-10-CM

## 2017-02-03 ENCOUNTER — Ambulatory Visit (HOSPITAL_COMMUNITY)
Admission: RE | Admit: 2017-02-03 | Discharge: 2017-02-03 | Disposition: A | Discharge status: Home / Self Care | Payer: No Typology Code available for payment source | Source: Ambulatory Visit | Attending: Gastroenterology | Admitting: Gastroenterology

## 2017-02-03 DIAGNOSIS — R945 Abnormal results of liver function studies: Secondary | ICD-10-CM | POA: Diagnosis not present

## 2017-02-03 DIAGNOSIS — R195 Other fecal abnormalities: Secondary | ICD-10-CM

## 2017-02-03 DIAGNOSIS — R1031 Right lower quadrant pain: Secondary | ICD-10-CM | POA: Diagnosis present

## 2017-02-03 DIAGNOSIS — R7989 Other specified abnormal findings of blood chemistry: Secondary | ICD-10-CM

## 2017-02-03 LAB — OVA AND PARASITE EXAMINATION
CONCENTRATE RESULT: NONE SEEN
MICRO NUMBER:: 81258534
SPECIMEN QUALITY:: ADEQUATE
TRICHROME RESULT: NONE SEEN

## 2017-02-03 LAB — ANA: Anti Nuclear Antibody(ANA): NEGATIVE

## 2017-02-03 LAB — HEPATITIS A ANTIBODY, TOTAL: HEPATITIS A AB,TOTAL: NONREACTIVE

## 2017-02-03 LAB — CLOSTRIDIUM DIFFICILE BY PCR: CDIFFPCR: NOT DETECTED

## 2017-02-03 LAB — FECAL LACTOFERRIN, QUANT
FECAL LACTOFERRIN: NEGATIVE
MICRO NUMBER: 81260011
SPECIMEN QUALITY:: ADEQUATE

## 2017-02-03 LAB — HEPATITIS C ANTIBODY
HEP C AB: NONREACTIVE
SIGNAL TO CUT-OFF: 0 (ref <1.00)

## 2017-02-03 LAB — MITOCHONDRIAL ANTIBODIES

## 2017-02-03 LAB — HEPATITIS B SURFACE ANTIBODY,QUALITATIVE: Hep B S Ab: NONREACTIVE

## 2017-02-03 LAB — ALPHA-1-ANTITRYPSIN: A-1 Antitrypsin, Ser: 135 mg/dL (ref 83–199)

## 2017-02-03 LAB — CERULOPLASMIN: CERULOPLASMIN: 29 mg/dL (ref 18–53)

## 2017-02-03 LAB — ANTI-SMOOTH MUSCLE ANTIBODY, IGG

## 2017-02-03 LAB — HEPATITIS B SURFACE ANTIGEN: Hepatitis B Surface Ag: NONREACTIVE

## 2017-02-06 LAB — STOOL CULTURE
MICRO NUMBER: 81258527
MICRO NUMBER: 81258528
MICRO NUMBER:: 81258526
SHIGA RESULT:: NOT DETECTED
SPECIMEN QUALITY: ADEQUATE
SPECIMEN QUALITY: ADEQUATE
SPECIMEN QUALITY:: ADEQUATE

## 2017-02-07 LAB — OB RESULTS CONSOLE RPR: RPR: NONREACTIVE

## 2017-02-07 LAB — OB RESULTS CONSOLE ABO/RH: RH Type: POSITIVE

## 2017-02-07 LAB — OB RESULTS CONSOLE ANTIBODY SCREEN: Antibody Screen: NEGATIVE

## 2017-02-07 LAB — OB RESULTS CONSOLE GC/CHLAMYDIA
CHLAMYDIA, DNA PROBE: NEGATIVE
GC PROBE AMP, GENITAL: NEGATIVE

## 2017-02-07 LAB — OB RESULTS CONSOLE RUBELLA ANTIBODY, IGM: RUBELLA: IMMUNE

## 2017-02-07 LAB — OB RESULTS CONSOLE HEPATITIS B SURFACE ANTIGEN: Hepatitis B Surface Ag: NEGATIVE

## 2017-02-07 LAB — OB RESULTS CONSOLE HIV ANTIBODY (ROUTINE TESTING): HIV: NONREACTIVE

## 2017-03-16 ENCOUNTER — Encounter: Payer: No Typology Code available for payment source | Admitting: Family Medicine

## 2017-03-28 NOTE — L&D Delivery Note (Addendum)
Delivery Note Patient pushed for less than 15 minutes after she was noted to be C/C/+2.  At 12:05 PM a viable and healthy female was delivered via Vaginal, Spontaneous (Presentation:LOA).  APGAR: 8, 9; weight pendning.  Baby laid on maternal abdomen,  Delayed cord clamping done for 1 minute.  Cord then cut by father.  Infant noted to be crying vigorously, moving all four extremities.  Cord blood obtained. Placenta spontaneously delivered intact sent to LD, 3 vessels noted.  Uterine atony appreciated, this was alleviated by uterine massage, IV pitocin and one dose Of IM methergine.  Hemostasis was achieved.  Second degree perineal laceration was repaired In routine fashion with 2-0 vicryl.  Patient tolerated delivery well. No complications.    Anesthesia:  Epidural  / 1%plain lidocaine Episiotomy: None Lacerations: 2nd degree Suture Repair: 2.0 vicryl Est. Blood Loss (mL):  400 cc  Mom to postpartum.  Baby to Couplet care / Skin to Skin.  Isadore Bokhari 12:47 PM 10/06/17

## 2017-04-06 ENCOUNTER — Encounter: Payer: Self-pay | Admitting: Family Medicine

## 2017-08-16 ENCOUNTER — Encounter: Payer: No Typology Code available for payment source | Attending: Obstetrics and Gynecology | Admitting: Registered"

## 2017-08-16 ENCOUNTER — Encounter: Payer: Self-pay | Admitting: Registered"

## 2017-08-16 DIAGNOSIS — Z713 Dietary counseling and surveillance: Secondary | ICD-10-CM | POA: Insufficient documentation

## 2017-08-16 DIAGNOSIS — O99810 Abnormal glucose complicating pregnancy: Secondary | ICD-10-CM | POA: Insufficient documentation

## 2017-08-16 DIAGNOSIS — O24419 Gestational diabetes mellitus in pregnancy, unspecified control: Secondary | ICD-10-CM | POA: Insufficient documentation

## 2017-08-16 NOTE — Progress Notes (Signed)
Patient was seen on 08/16/17 for Gestational Diabetes self-management class at the [facility]. The following learning objectives were met by the patient during this course:   States the definition of Gestational Diabetes  States why dietary management is important in controlling blood glucose  Describes the effects each nutrient has on blood glucose levels  Demonstrates ability to create a balanced meal plan  Demonstrates carbohydrate counting   States when to check blood glucose levels  Demonstrates proper blood glucose monitoring techniques  States the effect of stress and exercise on blood glucose levels  States the importance of limiting caffeine and abstaining from alcohol and smoking  Blood glucose monitor given: [employer] Lot # [id number] X Exp: 08/26/18 Blood glucose reading: 152  Patient instructed to monitor glucose levels: FBS: 60 - <95; 1 hour: <140; 2 hour: <120  Patient received handouts:  Nutrition Diabetes and Pregnancy, including carb counting list  Patient will be seen for follow-up as needed.

## 2017-09-07 LAB — OB RESULTS CONSOLE GBS: GBS: NEGATIVE

## 2017-09-22 ENCOUNTER — Telehealth (HOSPITAL_COMMUNITY): Payer: Self-pay | Admitting: *Deleted

## 2017-09-22 ENCOUNTER — Encounter (HOSPITAL_COMMUNITY): Payer: Self-pay | Admitting: *Deleted

## 2017-09-25 ENCOUNTER — Encounter (HOSPITAL_COMMUNITY): Payer: Self-pay | Admitting: *Deleted

## 2017-09-25 NOTE — Telephone Encounter (Signed)
Preadmission screen  

## 2017-10-05 ENCOUNTER — Other Ambulatory Visit: Payer: Self-pay | Admitting: Obstetrics & Gynecology

## 2017-10-06 ENCOUNTER — Inpatient Hospital Stay (HOSPITAL_COMMUNITY): Admit: 2017-10-06 | Payer: Self-pay

## 2017-10-06 ENCOUNTER — Inpatient Hospital Stay (HOSPITAL_COMMUNITY): Payer: No Typology Code available for payment source | Admitting: Anesthesiology

## 2017-10-06 ENCOUNTER — Inpatient Hospital Stay (HOSPITAL_COMMUNITY)
Admission: RE | Admit: 2017-10-06 | Discharge: 2017-10-07 | DRG: 807 | Disposition: A | Discharge status: Home / Self Care | Payer: No Typology Code available for payment source | Source: Ambulatory Visit | Attending: Obstetrics & Gynecology | Admitting: Obstetrics & Gynecology

## 2017-10-06 ENCOUNTER — Other Ambulatory Visit: Payer: Self-pay

## 2017-10-06 ENCOUNTER — Encounter (HOSPITAL_COMMUNITY): Payer: Self-pay

## 2017-10-06 DIAGNOSIS — O24429 Gestational diabetes mellitus in childbirth, unspecified control: Secondary | ICD-10-CM | POA: Diagnosis present

## 2017-10-06 DIAGNOSIS — O9962 Diseases of the digestive system complicating childbirth: Secondary | ICD-10-CM | POA: Diagnosis present

## 2017-10-06 DIAGNOSIS — K219 Gastro-esophageal reflux disease without esophagitis: Secondary | ICD-10-CM | POA: Diagnosis present

## 2017-10-06 DIAGNOSIS — Z3A39 39 weeks gestation of pregnancy: Secondary | ICD-10-CM | POA: Diagnosis not present

## 2017-10-06 DIAGNOSIS — O24419 Gestational diabetes mellitus in pregnancy, unspecified control: Secondary | ICD-10-CM | POA: Diagnosis present

## 2017-10-06 LAB — TYPE AND SCREEN
ABO/RH(D): O POS
Antibody Screen: NEGATIVE

## 2017-10-06 LAB — CBC
HEMATOCRIT: 26.8 % — AB (ref 36.0–46.0)
HEMOGLOBIN: 9.1 g/dL — AB (ref 12.0–15.0)
MCH: 21.8 pg — AB (ref 26.0–34.0)
MCHC: 34 g/dL (ref 30.0–36.0)
MCV: 64.3 fL — AB (ref 78.0–100.0)
PLATELETS: 188 10*3/uL (ref 150–400)
RBC: 4.17 MIL/uL (ref 3.87–5.11)
RDW: 16.3 % — ABNORMAL HIGH (ref 11.5–15.5)
WBC: 8.4 10*3/uL (ref 4.0–10.5)

## 2017-10-06 LAB — GLUCOSE, CAPILLARY
GLUCOSE-CAPILLARY: 98 mg/dL (ref 70–99)
GLUCOSE-CAPILLARY: 109 mg/dL — AB (ref 70–99)
Glucose-Capillary: 144 mg/dL — ABNORMAL HIGH (ref 70–99)
Glucose-Capillary: 173 mg/dL — ABNORMAL HIGH (ref 70–99)

## 2017-10-06 LAB — ABO/RH: ABO/RH(D): O POS

## 2017-10-06 LAB — RPR: RPR Ser Ql: NONREACTIVE

## 2017-10-06 MED ORDER — OXYCODONE-ACETAMINOPHEN 5-325 MG PO TABS: 1.0000 | ORAL_TABLET | ORAL | Status: DC | PRN

## 2017-10-06 MED ORDER — PHENYLEPHRINE 40 MCG/ML (10ML) SYRINGE FOR IV PUSH (FOR BLOOD PRESSURE SUPPORT)
80.0000 ug | PREFILLED_SYRINGE | INTRAVENOUS | Status: DC | PRN
Start: 2017-10-06 — End: 2017-10-06

## 2017-10-06 MED ORDER — OXYTOCIN 40 UNITS IN LACTATED RINGERS INFUSION - SIMPLE MED: 1.0000 m[IU]/min | INTRAVENOUS | Status: DC

## 2017-10-06 MED ORDER — ONDANSETRON HCL 4 MG/2ML IJ SOLN: 4.0000 mg | INTRAMUSCULAR | Status: DC | PRN

## 2017-10-06 MED ORDER — TERBUTALINE SULFATE 1 MG/ML IJ SOLN
0.2500 mg | Freq: Once | INTRAMUSCULAR | Status: DC | PRN
  Filled 2017-10-06: qty 1

## 2017-10-06 MED ORDER — PHENYLEPHRINE 40 MCG/ML (10ML) SYRINGE FOR IV PUSH (FOR BLOOD PRESSURE SUPPORT)
80.0000 ug | PREFILLED_SYRINGE | INTRAVENOUS | Status: DC | PRN
  Filled 2017-10-06: qty 5
  Filled 2017-10-06: qty 10

## 2017-10-06 MED ORDER — LORATADINE 10 MG PO TABS
10.0000 mg | ORAL_TABLET | Freq: Every day | ORAL | Status: DC
  Filled 2017-10-06: qty 1

## 2017-10-06 MED ORDER — MISOPROSTOL 25 MCG QUARTER TABLET
25.0000 ug | ORAL_TABLET | ORAL | Status: DC | PRN
  Administered 2017-10-06: 25 ug via VAGINAL
  Filled 2017-10-06 (×3): qty 1

## 2017-10-06 MED ORDER — COCONUT OIL OIL: 1.0000 "application " | TOPICAL_OIL | Status: DC | PRN

## 2017-10-06 MED ORDER — IBUPROFEN 600 MG PO TABS
600.0000 mg | ORAL_TABLET | Freq: Four times a day (QID) | ORAL | Status: DC
  Administered 2017-10-06 – 2017-10-07 (×3): 600 mg via ORAL
  Filled 2017-10-06 (×5): qty 1

## 2017-10-06 MED ORDER — METHYLERGONOVINE MALEATE 0.2 MG PO TABS: 0.2000 mg | ORAL_TABLET | ORAL | Status: DC | PRN

## 2017-10-06 MED ORDER — METHYLERGONOVINE MALEATE 0.2 MG/ML IJ SOLN
0.2000 mg | Freq: Once | INTRAMUSCULAR | Status: AC
  Administered 2017-10-06: 0.2 mg via INTRAMUSCULAR

## 2017-10-06 MED ORDER — OXYTOCIN 40 UNITS IN LACTATED RINGERS INFUSION - SIMPLE MED
1.0000 m[IU]/min | INTRAVENOUS | Status: DC
  Administered 2017-10-06: 1 m[IU]/min via INTRAVENOUS

## 2017-10-06 MED ORDER — LACTATED RINGERS IV SOLN: 500.0000 mL | Freq: Once | INTRAVENOUS | Status: DC

## 2017-10-06 MED ORDER — DIPHENHYDRAMINE HCL 25 MG PO CAPS: 25.0000 mg | ORAL_CAPSULE | Freq: Four times a day (QID) | ORAL | Status: DC | PRN

## 2017-10-06 MED ORDER — SIMETHICONE 80 MG PO CHEW
80.0000 mg | CHEWABLE_TABLET | ORAL | Status: DC | PRN
  Filled 2017-10-06: qty 1

## 2017-10-06 MED ORDER — FLEET ENEMA 7-19 GM/118ML RE ENEM: 1.0000 | ENEMA | RECTAL | Status: DC | PRN

## 2017-10-06 MED ORDER — OXYTOCIN BOLUS FROM INFUSION: 500.0000 mL | Freq: Once | INTRAVENOUS | Status: DC

## 2017-10-06 MED ORDER — OXYTOCIN 40 UNITS IN LACTATED RINGERS INFUSION - SIMPLE MED: 2.5000 [IU]/h | INTRAVENOUS | Status: DC

## 2017-10-06 MED ORDER — PANTOPRAZOLE SODIUM 40 MG PO TBEC
40.0000 mg | DELAYED_RELEASE_TABLET | Freq: Every day | ORAL | Status: DC
  Filled 2017-10-06: qty 1

## 2017-10-06 MED ORDER — DIBUCAINE 1 % RE OINT: 1.0000 "application " | TOPICAL_OINTMENT | RECTAL | Status: DC | PRN

## 2017-10-06 MED ORDER — LACTATED RINGERS IV SOLN
INTRAVENOUS | Status: DC
  Administered 2017-10-06: 01:00:00 via INTRAVENOUS

## 2017-10-06 MED ORDER — EPHEDRINE 5 MG/ML INJ
10.0000 mg | INTRAVENOUS | Status: DC | PRN
  Filled 2017-10-06: qty 2

## 2017-10-06 MED ORDER — LIDOCAINE HCL (PF) 1 % IJ SOLN
30.0000 mL | INTRAMUSCULAR | Status: DC | PRN
  Administered 2017-10-06: 30 mL via SUBCUTANEOUS
  Filled 2017-10-06: qty 30

## 2017-10-06 MED ORDER — ONDANSETRON HCL 4 MG/2ML IJ SOLN: 4.0000 mg | Freq: Four times a day (QID) | INTRAMUSCULAR | Status: DC | PRN

## 2017-10-06 MED ORDER — PHENYLEPHRINE 40 MCG/ML (10ML) SYRINGE FOR IV PUSH (FOR BLOOD PRESSURE SUPPORT)
80.0000 ug | PREFILLED_SYRINGE | INTRAVENOUS | Status: DC | PRN
  Filled 2017-10-06: qty 5

## 2017-10-06 MED ORDER — OXYTOCIN 40 UNITS IN LACTATED RINGERS INFUSION - SIMPLE MED
1.0000 m[IU]/min | INTRAVENOUS | Status: DC
  Filled 2017-10-06: qty 1000

## 2017-10-06 MED ORDER — PRENATAL MULTIVITAMIN CH
1.0000 | ORAL_TABLET | Freq: Every day | ORAL | Status: DC
  Filled 2017-10-06: qty 1

## 2017-10-06 MED ORDER — ACETAMINOPHEN 325 MG PO TABS: 650.0000 mg | ORAL_TABLET | ORAL | Status: DC | PRN

## 2017-10-06 MED ORDER — LACTATED RINGERS IV SOLN: INTRAVENOUS | Status: DC

## 2017-10-06 MED ORDER — LACTATED RINGERS IV SOLN: 500.0000 mL | INTRAVENOUS | Status: DC | PRN

## 2017-10-06 MED ORDER — EPHEDRINE 5 MG/ML INJ: 10.0000 mg | INTRAVENOUS | Status: DC | PRN

## 2017-10-06 MED ORDER — SENNOSIDES-DOCUSATE SODIUM 8.6-50 MG PO TABS
2.0000 | ORAL_TABLET | ORAL | Status: DC
  Administered 2017-10-06: 2 via ORAL
  Filled 2017-10-06: qty 2

## 2017-10-06 MED ORDER — METHYLERGONOVINE MALEATE 0.2 MG/ML IJ SOLN: 0.2000 mg | INTRAMUSCULAR | Status: DC | PRN

## 2017-10-06 MED ORDER — MISOPROSTOL 200 MCG PO TABS
ORAL_TABLET | ORAL | Status: AC
  Filled 2017-10-06: qty 5

## 2017-10-06 MED ORDER — DIPHENHYDRAMINE HCL 50 MG/ML IJ SOLN: 12.5000 mg | INTRAMUSCULAR | Status: DC | PRN

## 2017-10-06 MED ORDER — ZOLPIDEM TARTRATE 5 MG PO TABS: 5.0000 mg | ORAL_TABLET | Freq: Every evening | ORAL | Status: DC | PRN

## 2017-10-06 MED ORDER — TETANUS-DIPHTH-ACELL PERTUSSIS 5-2.5-18.5 LF-MCG/0.5 IM SUSP: 0.5000 mL | Freq: Once | INTRAMUSCULAR | Status: DC

## 2017-10-06 MED ORDER — BENZOCAINE-MENTHOL 20-0.5 % EX AERO
1.0000 "application " | INHALATION_SPRAY | CUTANEOUS | Status: DC | PRN
  Administered 2017-10-06: 1 via TOPICAL
  Filled 2017-10-06: qty 56

## 2017-10-06 MED ORDER — LIDOCAINE HCL (PF) 1 % IJ SOLN
INTRAMUSCULAR | Status: DC | PRN
  Administered 2017-10-06: 5 mL via EPIDURAL

## 2017-10-06 MED ORDER — FENTANYL CITRATE (PF) 100 MCG/2ML IJ SOLN
50.0000 ug | INTRAMUSCULAR | Status: DC | PRN
Start: 2017-10-06 — End: 2017-10-06

## 2017-10-06 MED ORDER — ONDANSETRON HCL 4 MG PO TABS: 4.0000 mg | ORAL_TABLET | ORAL | Status: DC | PRN

## 2017-10-06 MED ORDER — SOD CITRATE-CITRIC ACID 500-334 MG/5ML PO SOLN: 30.0000 mL | ORAL | Status: DC | PRN

## 2017-10-06 MED ORDER — WITCH HAZEL-GLYCERIN EX PADS: 1.0000 "application " | MEDICATED_PAD | CUTANEOUS | Status: DC | PRN

## 2017-10-06 MED ORDER — FENTANYL 2.5 MCG/ML BUPIVACAINE 1/10 % EPIDURAL INFUSION (WH - ANES)
14.0000 mL/h | INTRAMUSCULAR | Status: DC | PRN
  Administered 2017-10-06: 14 mL/h via EPIDURAL
  Filled 2017-10-06: qty 100

## 2017-10-06 MED ORDER — OXYCODONE-ACETAMINOPHEN 5-325 MG PO TABS: 2.0000 | ORAL_TABLET | ORAL | Status: DC | PRN

## 2017-10-06 NOTE — Anesthesia Pain Management Evaluation Note (Signed)
  CRNA Pain Management Visit Note  Patient: Diane Kirk, 42 y.o., female  "Hello I am a member of the anesthesia team at [facility]. We have an anesthesia team available at all times to provide care throughout the hospital, including epidural management and anesthesia for C-section. I don't know your plan for the delivery whether it a natural birth, water birth, IV sedation, nitrous supplementation, doula or epidural, but we want to meet your pain goals."   1.Was your pain managed to your expectations on prior hospitalizations?   No   2.What is your expectation for pain management during this hospitalization?     Epidural  3.How can we help you reach that goal? Support prn  Record the patient's initial score and the patient's pain goal.   Pain: 4  Pain Goal: 6 [facility] wants you to be able to say your pain was always managed very well.  [facility],Diane Kirk 10/06/2017

## 2017-10-06 NOTE — Anesthesia Preprocedure Evaluation (Signed)
Anesthesia Evaluation  Patient identified by MRN, date of birth, ID band Patient awake    Reviewed: Allergy & Precautions, NPO status , Patient's Chart, lab work & pertinent test results  History of Anesthesia Complications (+) history of anesthetic complications  Airway Mallampati: II  TM Distance: >3 FB Neck ROM: Full    Dental no notable dental hx. (+) Caps, Teeth Intact   Pulmonary neg pulmonary ROS,    Pulmonary exam normal breath sounds clear to auscultation       Cardiovascular Exercise Tolerance: Good negative cardio ROS Normal cardiovascular exam Rhythm:Regular Rate:Normal     Neuro/Psych  Headaches, Anxiety    GI/Hepatic GERD  ,  Endo/Other  diabetes  Renal/GU      Musculoskeletal   Abdominal   Peds  Hematology   Anesthesia Other Findings   Reproductive/Obstetrics                             Lab Results  Component Value Date   WBC 8.4 10/06/2017   HGB 9.1 (L) 10/06/2017   HCT 26.8 (L) 10/06/2017   MCV 64.3 (L) 10/06/2017   PLT 188 10/06/2017    Anesthesia Physical Anesthesia Plan  ASA: III  Anesthesia Plan: Epidural   Post-op Pain Management:    Induction:   PONV Risk Score and Plan:   Airway Management Planned:   Additional Equipment:   Intra-op Plan:   Post-operative Plan:   Informed Consent: I have reviewed the patients History and Physical, chart, labs and discussed the procedure including the risks, benefits and alternatives for the proposed anesthesia with the patient or authorized representative who has indicated his/her understanding and acceptance.     Plan Discussed with:   Anesthesia Plan Comments:         Anesthesia Quick Evaluation

## 2017-10-06 NOTE — Anesthesia Procedure Notes (Signed)
Epidural Patient location during procedure: OB Start time: 10/06/2017 9:40 AM End time: 10/06/2017 9:53 AM  Staffing Anesthesiologist: Trevor IhaHouser, Harini Dearmond A, MD Performed: anesthesiologist   Preanesthetic Checklist Completed: patient identified, site marked, surgical consent, pre-op evaluation, timeout performed, IV checked, risks and benefits discussed and monitors and equipment checked  Epidural Patient position: sitting Prep: site prepped and draped and DuraPrep Patient monitoring: continuous pulse ox and blood pressure Approach: midline Location: L3-L4 Injection technique: LOR air  Needle:  Needle type: Tuohy  Needle gauge: 17 G Needle length: 9 cm and 9 Needle insertion depth: 6 cm Catheter type: closed end flexible Catheter size: 19 Gauge Catheter at skin depth: 12 cm Test dose: negative  Assessment Events: blood not aspirated, injection not painful, no injection resistance, negative IV test and no paresthesia

## 2017-10-06 NOTE — Progress Notes (Signed)
Labor Progress Note  Subjective: Diane Kirk, 41 y.o., G3P1011, with an IUP @ 3117w1d, presented for IOL: GDMA2 on glyburide, and AMA. Pt is resting in room in bed with family at bedside, pt denies pain but feeling contractions that she is able to breath through, pt wants epidural when pain get worse. Pt stated her pelvis is proven to 8.5 lbs baby that is now 12eight years old. Pt thus far denies leakage of fluid, fever, vaginal bleeding and endorse +FM.   Patient Active Problem List   Diagnosis Date Noted  . Gestational diabetes mellitus (GDM) affecting second pregnancy 10/06/2017  . Gestational diabetes mellitus (GDM), antepartum 08/16/2017  . 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 Seasonal allergies 06/09/2014  . Migraine 04/12/2012   Objective: BP (!) 101/52   Pulse 73   Temp 98.2 F (36.8 C)   Resp 18   Ht 5\' 10"  (1.778 m)   Wt 92.1 kg (203 lb)   LMP 01/05/2017 (Exact Date)   BMI 29.13 kg/m  I/O last 3 completed shifts: In: 114.4 [I.V.:114.4] Out: -  Total I/O In: 747.7 [I.V.:747.7] Out: -  NST: FHR baseline 145 bpm, Variability: moderate, Accelerations:present, Decelerations:  Absent= Cat 1/Reactive CTX:  regular, every 6 minutes Uterus gravid, soft non tender, moderate to palpate with contractions.  SVE:  Dilation: 4 Effacement (%): 60 Station: -2 Exam by:: [facility] Haleigh Desmith Pitocin at 4 mUn/min  Assessment:  Diane Kirk, 41 y.o., G3P1011, with an IUP @ 6217w1d, presented for  IOL: GDMA2 on glyburide, and AMA. Patient Active Problem List   Diagnosis Date Noted  . Gestational diabetes mellitus (GDM) affecting second pregnancy 10/06/2017  . Gestational diabetes mellitus (GDM), antepartum 08/16/2017  . 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 Seasonal allergies 06/09/2014  . Migraine 04/12/2012   NICHD: Category 1  Membranes:  SROM during exam, clear fluids  x 10mins, no s/s of infection  MVUs: No IUPC  Induction:    Cytotec x 1 at   Pitocin - 4  Pain management:               Epidural  placement:  at (Not yet Placed)   GBS negative  A1GDM: stable sugars CBG (last 3)  Recent Labs    10/06/17 0427 10/06/17 0834  GLUCAP 98 109*   Plan: Continue labor plan Continuous monitoring Rest Frequent position changes to facilitate fetal rotation and descent. Will reassess with cervical exam at 1200 or earlier if necessary Continue pitocin per protocol at 2x2 Anticipate labor progression and vaginal delivery.   Md Pinn aware of plan and verbalized agreement.   Dale DurhamJade Kimmie Doren, NP-C, CNM, MSN 10/06/2017. 8:30 AM

## 2017-10-06 NOTE — H&P (Addendum)
Diane Kirk is a 42 y.o. female, G3P1011 at 39.1 weeks, presenting for induction of labor due to [facility].  Pt prenatal hx remarkable for AMA,.  FM +.  Controlled with glyburide.  Patient Active Problem List   Diagnosis Date Noted  . Gestational diabetes mellitus (GDM), antepartum 08/16/2017  . 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 Seasonal allergies 06/09/2014  . Migraine 04/12/2012    History of present pregnancy: Patient entered care at 9.1  weeks.   EDC of 10/12/2017 was established by LMP.   Anatomy scan:  20.4 weeks, with normal findings and an posterior placenta.   Additional [place] evaluations:  Growth and antenatal testing for GDM and AMA   Significant prenatal events:  None   Last evaluation:  Last week  OB History    Gravida  3   Para  1   Term  1   Preterm      AB  1   Living  1     SAB  1   TAB      Ectopic      Multiple      Live Births  1          Past Medical History:  Diagnosis Date  . Anxiety   . Complication of anesthesia    epidural did not work  . Gastroesophageal reflux disease   . Gestational diabetes    glyburide  . Hemoglobin C trait (HCC)   . Migraine headache   . Psoriasis    Past Surgical History:  Procedure Laterality Date  . APPENDECTOMY    . BREAST CYST EXCISION    . ivf    . laproscopy    . REFRACTIVE SURGERY Bilateral   . sebaceous cyst resection     Scalp   Family History: family history includes Breast cancer in her maternal grandmother; Diabetes in her paternal aunt; Heart disease in her maternal grandfather; Heart disease (age of onset: 20) in her paternal uncle; Hyperlipidemia in her father and mother; Hypertension in her father and mother; Kidney disease in her paternal aunt; Liver cancer in her father. Social History:  reports that she has never smoked. She has never used smokeless tobacco. She reports that she does not drink alcohol or use drugs.   Prenatal Transfer Tool  Maternal Diabetes: Yes:  Diabetes Type:  Diet  controlled Genetic Screening: Normal Maternal Ultrasounds/Referrals: Normal Fetal Ultrasounds or other Referrals:  None Maternal Substance Abuse:  No Significant Maternal Medications:  None Significant Maternal Lab Results: None  TDAP UTD Flu Had flu in pregnancy  ROS:  All 10 systems reviewed except negative as stated above  Allergies  Allergen Reactions  . Azithromycin Other (See Comments)    Vertigo   . Tape Rash    Creates a bumpy rash       Last menstrual period 01/05/2017.  Chest clear Heart RRR without murmur Abd gravid, NT, FH appropriate Pelvic: per rn Ext: Negative  FHR: Category FHT BL 130 accels variabiilty present UCs:  None  Prenatal labs: ABO, Rh: O/Positive/-- (11/13 0000) Antibody: Negative (11/13 0000) Rubella:  Immune (11/13 0000) RPR: Nonreactive (11/13 0000)  HBsAg: Negative (11/13 0000)  HIV: Non-reactive (11/13 0000)  GBS: Negative (06/13 0000) Sickle cell/Hgb electrophoresis:  AC Pap:  2018 wnl GC:  Neg Chlamydia:  Neg Genetic screenings:  Neg Glucola:  149 Other:  3 hour 2 abnormal values Hgb 11.9 at NOB, 10 at 28 weeks       Assessment/Plan: IUP at 39.1 induction  for GDMA2 and AMA Cat 1 strip  Plan: Admit to Birthing Suite  Routine [facility] orders Pain med/epidural prn   Henderson NewcomerNancy Jean ProtheroCNM, MSN 10/06/2017, 12:16 AM

## 2017-10-06 NOTE — Anesthesia Postprocedure Evaluation (Signed)
Anesthesia Post Note  Patient: Diane Kirk  Procedure(s) Performed: AN AD HOC LABOR EPIDURAL     Patient location during evaluation: Mother Baby Anesthesia Type: Epidural Level of consciousness: awake and alert Pain management: pain level controlled Vital Signs Assessment: post-procedure vital signs reviewed and stable Respiratory status: spontaneous breathing, nonlabored ventilation and respiratory function stable Cardiovascular status: stable Postop Assessment: no headache, no backache and epidural receding Anesthetic complications: no    Last Vitals:  Vitals:   10/06/17 1430 10/06/17 1530  BP: 109/69 114/63  Pulse: (!) 58 88  Resp: 18 20  Temp: 36.8 C 36.7 C    Last Pain:  Vitals:   10/06/17 1530  TempSrc: Oral  PainSc: 0-No pain   Pain Goal:                 [name]

## 2017-10-07 LAB — CBC
HEMATOCRIT: 25.8 % — AB (ref 36.0–46.0)
Hemoglobin: 8.6 g/dL — ABNORMAL LOW (ref 12.0–15.0)
MCH: 22.1 pg — ABNORMAL LOW (ref 26.0–34.0)
MCHC: 33.3 g/dL (ref 30.0–36.0)
MCV: 66.2 fL — AB (ref 78.0–100.0)
Platelets: 168 10*3/uL (ref 150–400)
RBC: 3.9 MIL/uL (ref 3.87–5.11)
RDW: 16.4 % — AB (ref 11.5–15.5)
WBC: 11.7 10*3/uL — AB (ref 4.0–10.5)

## 2017-10-07 LAB — GLUCOSE, CAPILLARY
Glucose-Capillary: 99 mg/dL (ref 70–99)
Glucose-Capillary: 137 mg/dL — ABNORMAL HIGH (ref 70–99)

## 2017-10-07 MED ORDER — IBUPROFEN 600 MG PO TABS: 600.0000 mg | ORAL_TABLET | Freq: Four times a day (QID) | ORAL | 0 refills | Status: DC

## 2017-10-07 NOTE — Lactation Note (Signed)
This note was copied from a baby's chart. Lactation Consultation Note  Patient Name: Diane Ivar DrapeYaiko Wulf WUJWJ'XToday's Date: 10/07/2017 Reason for consult: Initial assessment  Initial visit at 24 hours of life. Mom is a P2 who nursed her 1st child for 6 weeks, but quit b/c of mastitis.   Mom says that this infant is nursing well, but she did give formula x 2 overnight. Mom brought her own formula (Enfamil NeuroPro) and bottle teat (Enfamil standard flow) from home. The same bottle was used for 2 different feedings. Education was given on not using formula from same bottle for different feedings, etc.  Mom says that with her 1st child she had a "plentiful" supply. I reminded Mom that mastitis tends to occur when there's milk stasis in the breast. I also advised Mom that if mastitis were to occur again, not to quit breastfeeding, but rather, to please call us, finish her antibiotic series and then she can consider weaning.   Diane Kirk, Diane Kirk 10/07/2017, 1:00 PM

## 2017-10-07 NOTE — Progress Notes (Signed)
CSW received consult for MOB due to history of anxiety that began in April 2019. CSW met with MOB to discuss prior history. MOB reports not ever having anxiety and she is unsure as to why that is mentioned in her chart. Prior to meeting with MOB, CSW completed chart review and only found one instance where anxiety was mentioned, which was in April. No other documentation is in MOB's chart to support that diagnosis. MOB reports that she believes that was entered to her chart in error. CSW apologized for that mistake. MOB reports she is doing well without any concerns.  Diane Kirk, MSW, [place] Social Worker [facility] [phone]

## 2017-10-07 NOTE — Discharge Instructions (Signed)

## 2017-10-07 NOTE — Discharge Summary (Signed)
OB Discharge Summary     Patient Name: Diane Kirk DOB: 05-18-1975 MRN: [id number]  Date of admission: 10/06/2017 Delivering MD: Dale DurhamMONTANA, JADE   Date of discharge: 10/07/2017  Admitting diagnosis: induction Intrauterine pregnancy: 3220w1d     Secondary diagnosis:  Active Problems:   Gestational diabetes mellitus (GDM) affecting second pregnancy  Additional problems:      Discharge diagnosis: Term Pregnancy Delivered                                                                                                Post partum procedures:  Augmentation: Pitocin and Cytotec  Complications: None  Hospital course:  Induction of Labor With Vaginal Delivery   42 y.o. yo [id number] at 5620w1d was admitted to the hospital 10/06/2017 for induction of labor.  Indication for induction: A2 DM.  Patient had an uncomplicated labor course as follows: Membrane Rupture Time/Date: 8:38 AM ,10/06/2017   Intrapartum Procedures: Episiotomy: None [1]                                         Lacerations:  2nd degree [3]  Patient had delivery of a Viable infant.  Information for the patient's newborn:  Darrick MeigsBrown, Girl Tiahna [[id number]][[id number]]      10/06/2017  Details of delivery can be found in separate delivery note.  Patient had a routine postpartum course. Patient is discharged home 10/07/17.  Physical exam  Vitals:   10/06/17 2257 10/07/17 0325 10/07/17 0620 10/07/17 0820  BP: 104/65 98/69 (!) 86/54 105/73  Pulse: 75 81 74 80  Resp: 18 18 18 16   Temp: 97.8 F (36.6 C) 98.4 F (36.9 C) 98.2 F (36.8 C) 97.8 F (36.6 C)  TempSrc: Oral Oral Oral Oral  SpO2:    99%  Weight:      Height:       General: alert, cooperative and no distress Lochia: appropriate Uterine Fundus: firm Incision:  DVT Evaluation: No evidence of DVT seen on physical exam. Labs: Lab Results  Component Value Date   WBC 11.7 (H) 10/07/2017   HGB 8.6 (L) 10/07/2017   HCT 25.8 (L) 10/07/2017   MCV 66.2 (L) 10/07/2017   PLT 168  10/07/2017   CMP Latest Ref Rng & Units 01/31/2017  Glucose 70 - 99 mg/dL -  BUN 6 - 23 mg/dL -  Creatinine 2.950.40 - 2.841.20 mg/dL -  Sodium 132135 - 440145 mEq/L -  Potassium 3.5 - 5.1 mEq/L -  Chloride 96 - 112 mEq/L -  CO2 19 - 32 mEq/L -  Calcium 8.4 - 10.5 mg/dL -  Total Protein 6.0 - 8.3 g/dL 7.9  Total Bilirubin 0.2 - 1.2 mg/dL 1.1  Alkaline Phos 39 - 117 U/L 51  AST 0 - 37 U/L 12  ALT 0 - 35 U/L 7    Discharge instruction: per After Visit Summary and "Baby and Me Booklet".  After visit meds:  Allergies as of 10/07/2017      Reactions   Azithromycin Other (See  Comments)   Vertigo   Tape Rash   Creates a bumpy rash      Medication List    TAKE these medications   ibuprofen 600 MG tablet Commonly known as:  ADVIL,MOTRIN Take 1 tablet (600 mg total) by mouth every 6 (six) hours.       Diet: routine diet  Activity: Advance as tolerated. Pelvic rest for 6 weeks.   Outpatient follow up:6 weeks Follow up Appt:No future appointments. Follow up Visit:No follow-ups on file.  Postpartum contraception: Undecided  Newborn Data: Live born female  Birth Weight: 8 lb 7 oz (3827 g) APGAR: 8, 9  Newborn Delivery   Birth date/time:  10/06/2017 12:05:00 Delivery type:  Vaginal, Spontaneous    Pt requesting to go home today; Can discharge if Blood glucose stable  Baby Feeding: Breast Disposition:home with mother   10/07/2017 Rhea Pink, CNM

## 2017-11-24 ENCOUNTER — Ambulatory Visit: Payer: Self-pay

## 2017-11-24 NOTE — Lactation Note (Addendum)
This note was copied from a baby's chart. 11/24/2017  Name: Diane Kirk MRN: [id number] Date of Birth: 10/06/2016 Gestational Age: Gestational Age: [redacted]w[redacted]d Birth Weight: 135 oz Weight today:    11 pounds 3.6 ounces in clean size 1 diaper   Dierdre Harness presents today with mom for feeding assessment. Mom is concerned that infant will pull off with feeding and mom is experiencing pain with feeding.   Infant has gained 1385 grams in the last 48 days with an average daily weight gain of 29 grams a day.   Mom reports infant is pulling on and off the breast in the last 2 weeks. Infant is noted to click with the entire feeding. Mom reports infant did not click until the last few weeks. Mom is reporting pain with the feeding after infant is latched and feeding. Infant is noted to be shallow at the breast and does pull on and off the breast. Possibly when the breast is getting less full and flow diminishes. Infant tires easily at the breast.   Mom has been using the [facility] with each feeding until a few days ago. She reports infant is feeding every 2 hours and usually uses one breast per feeding. She has been getting 2 bottles of pumped milk a day until the last few days. Mom reports infant has not been acting more hungry.   Infant noted to have labial frenulum that inserts a the bottom of the gum ridge. Upper lip tight with flanging and on the breast.  Infant noted to pull tongue back behind gumline when suckling on gloved finger, she is noted to click and pull on and off the breast. Infant gaggy with fingers inserted in the mouth. Infant with tongue thrusting on gloved finger and breast. infant noted to have short posterior lingual frenulum. Infant with good tongue extension and lateralization. Infant with some limited mid tongue elevation. Infant with weak suckle at times on gloved finger. Mom reports she had to change her to a 0 flow bottle as infant was choking and gulping milk. Mom reports she doesn't bottle  feed well and takes a while to organize suckle. Discussed how tongue and lip restrictions can effect milk supply and gave mom website information and local providers.   Infant latched and fed on both breasts for about 10-15 minutes each side. She does tend to want to latch shallowly and to pull on and off the breast. Mom with pinching to nipples with feeding. Nipples are longer in general and infant wants to stay on the end of the nipple. She does not appear to have oversupply and no choking was observed. Infant transferred 106 ml with the feeding, she went on and fed again at the end of the consult.   Infant to follow up with Elza Rafter, PNP the 2nd week of September. Mom aware of BF Support Groups. Infant to follow up 1-2 days post tongue/lip release and prn.   Mom reports all questions have been answered at this time. Mom knows to call with any questions/concerns as needed.     General Information: Mother's reason for visit: nipple pain, feeding assessment, diffuculty latching Consult: Initial Lactation consultant: Noralee Stain RN,IBCLC Breastfeeding experience: pulling on and off the breast, pain with feeding Maternal medical conditions: Infertility, Gestational diabetes mellitus, Other(cyst removed from right breast, scar under breast, facial hair to chin) Maternal medications: Pre-natal vitamin  Breastfeeding History: Frequency of breast feeding: every 2-2.5 hours Duration of feeding: 10-15 minutes, ususally one side  Supplementation:  Supplement method: bottle(Tommie Tippee size 0)         Breast milk volume: 2 ounces Breast milk frequency: BID   Pump type: Medela pump in style Pump frequency: 0 currently, was using Haakaa with every feeding Pump volume: 2 ounces  Infant Output Assessment: Voids per 24 hours: 10 Urine color: Clear yellow Stools per 24 hours: 6 Stool color: Yellow  Breast Assessment: Breast: Filling Nipple: Erect Pain level: (with feedings, sore post  feeding) Pain interventions: Bra, Lanolin  Feeding Assessment: Infant oral assessment: Variance Infant oral assessment comment: Infant noted to have labial frenulum that inserts a the bottom of the gum ridge. Upper lip tight with flanging and on the breast.  Infant noted to pull tongue back behind gumline when suckling on gloved finger, she is noted to click and pull on and off the breast. Infant gaggy with fingers inserted in the mouth. Infant with tongue thrusting on gloved finger and breast. infant noted to have short posterior lingual frenulum. Infant with good tongue extension and lateralization. Infant with some mid tongue elevation. Infant with weak suckle at times on gloved finger. Mom reports she had to change her to a 0 flow bottle as infant was choking and gulping milk. Mom reports she doesn't bottle feed well and takes a while to organize suckle.  Positioning: Cradle Latch: 1 - Repeated attempts needed to sustain latch, nipple held in mouth throughout feeding, stimulation needed to elicit sucking reflex. Audible swallowing: 2 - Spontaneous and intermittent Type of nipple: 2 - Everted at rest and after stimulation Comfort: 1 - Filling, red/small blisters or bruises, mild/mod discomfort Hold: 2 - No assistance needed to correctly position infant at breast LATCH score: 8 Latch assessment: Shallow Lips flanged: No(flanged upper lip wtih feeding) Suck assessment: Displays both   Pre-feed weight: 5090 grams Post feed weight: 5122 grams Amount transferred: 32 ml Amount supplemented: 0  Additional Feeding Assessment: Infant oral assessment: Variance Infant oral assessment comment: Infant noted to have labial frenulum that inserts a the bottom of the gum ridge. Upper lip tight with flanging and on the breast.  Infant noted to pull tongue back behind gumline when suckling on gloved finger, she is noted to click and pull on and off the breast. Infant gaggy with fingers inserted in the mouth.  Infant with tongue thrusting on gloved finger and breast. infant noted to have short posterior lingual frenulum. Infant with good tongue extension and lateralization. Infant with some mid tongue elevation. Infant with weak suckle at times on gloved finger. Mom reports she had to change her to a 0 flow bottle as infant was choking and gulping milk. Mom reports she doesn't bottle feed well and takes a while to organize suckle.  Positioning: Cradle(right breast) Latch: 1 - Repeated attempts neede to sustain latch, nipple held in mouth throughout feeding, stimulation needed to elicit sucking reflex. Audible swallowing: 2 - Spontaneous and intermittent Type of nipple: 2 - Everted at rest and after stimulation Comfort: 1 - Filling, red/small blisters or bruises, mild/mod discomfort Hold: 2 - No assistance needed to correctly position infant at breast LATCH score: 8 Latch assessment: Deep Lips flanged: No(upper lip needs flanging) Suck assessment: Displays both   Pre-feed weight: 5122 grams Post feed weight: 5196 grams Amount transferred: 74 ml Amount supplemented: 0  Totals: Total amount transferred: 106 ml Total supplement given: 0 Total amount pumped post feed: did not pump   Plan:  1. Offer infant the breast with feeding cues 2. Allow  infant to suck on finger prior to latch and tug out gently to play tug of war with the tongue before latching 3. Make sure infant stays deep on the breast with feeding 4. Flange upper lip after latch as needed 5. Offer both breasts with each feeding, empty the first breast before offering the second breast 6. You can continue using your Haakaa or pumping after breast feeding 2-4 x a day to protect milk supply 7. Feed infant any pumped breast milk that you have available as she needs it 8. Consider having infant evaluated by oral specialist 9. Keep up the good work 10. Thank you for allowing me to assist you today 11. Please call with any questions/concerns  as needed [phone] 12.  Please call and reschedule with Lactation 1-2 days after tongue and lip release and as needed  Ed BlalockSharon S Kayson Bullis RN, IBCLC                                                     Silas FloodSharon S Jameek Bruntz 11/24/2017, 10:35 AM

## 2018-10-23 ENCOUNTER — Other Ambulatory Visit: Payer: Self-pay

## 2018-10-23 ENCOUNTER — Other Ambulatory Visit: Payer: Self-pay | Admitting: Internal Medicine

## 2018-10-23 ENCOUNTER — Ambulatory Visit: Payer: No Typology Code available for payment source | Admitting: Internal Medicine

## 2018-10-23 ENCOUNTER — Encounter: Payer: Self-pay | Admitting: Internal Medicine

## 2018-10-23 VITALS — BP 110/80 | HR 78 | Temp 99.0°F | Ht 70.0 in | Wt 173.1 lb

## 2018-10-23 DIAGNOSIS — M791 Myalgia, unspecified site: Secondary | ICD-10-CM | POA: Diagnosis not present

## 2018-10-23 DIAGNOSIS — R200 Anesthesia of skin: Secondary | ICD-10-CM | POA: Diagnosis not present

## 2018-10-23 DIAGNOSIS — R202 Paresthesia of skin: Secondary | ICD-10-CM

## 2018-10-23 DIAGNOSIS — E559 Vitamin D deficiency, unspecified: Secondary | ICD-10-CM | POA: Insufficient documentation

## 2018-10-23 DIAGNOSIS — O99019 Anemia complicating pregnancy, unspecified trimester: Secondary | ICD-10-CM

## 2018-10-23 DIAGNOSIS — R7302 Impaired glucose tolerance (oral): Secondary | ICD-10-CM | POA: Diagnosis not present

## 2018-10-23 LAB — COMPREHENSIVE METABOLIC PANEL
ALT: 7 U/L (ref 0–35)
AST: 12 U/L (ref 0–37)
Albumin: 4.6 g/dL (ref 3.5–5.2)
Alkaline Phosphatase: 89 U/L (ref 39–117)
BUN: 10 mg/dL (ref 6–23)
CO2: 29 mEq/L (ref 19–32)
Calcium: 9.8 mg/dL (ref 8.4–10.5)
Chloride: 104 mEq/L (ref 96–112)
Creatinine, Ser: 0.85 mg/dL (ref 0.40–1.20)
GFR: 73.46 mL/min (ref >60.00)
Glucose, Bld: 93 mg/dL (ref 70–99)
Potassium: 4.1 mEq/L (ref 3.5–5.1)
Sodium: 140 mEq/L (ref 135–145)
Total Bilirubin: 1.2 mg/dL (ref 0.2–1.2)
Total Protein: 7.6 g/dL (ref 6.0–8.3)

## 2018-10-23 LAB — CBC WITH DIFFERENTIAL/PLATELET
Basophils Absolute: 0.1 10*3/uL (ref 0.0–0.1)
Basophils Relative: 0.9 % (ref 0.0–3.0)
Eosinophils Absolute: 0.1 10*3/uL (ref 0.0–0.7)
Eosinophils Relative: 0.9 % (ref 0.0–5.0)
HCT: 40.3 % (ref 36.0–46.0)
Hemoglobin: 13 g/dL (ref 12.0–15.0)
Lymphocytes Relative: 29.7 % (ref 12.0–46.0)
Lymphs Abs: 1.7 10*3/uL (ref 0.7–4.0)
MCHC: 32.3 g/dL (ref 30.0–36.0)
MCV: 71.5 fl — ABNORMAL LOW (ref 78.0–100.0)
Monocytes Absolute: 0.3 10*3/uL (ref 0.1–1.0)
Monocytes Relative: 5.1 % (ref 3.0–12.0)
Neutro Abs: 3.6 10*3/uL (ref 1.4–7.7)
Neutrophils Relative %: 63.4 % (ref 43.0–77.0)
Platelets: 219 10*3/uL (ref 150.0–400.0)
RBC: 5.63 Mil/uL — ABNORMAL HIGH (ref 3.87–5.11)
RDW: 14.7 % (ref 11.5–15.5)
WBC: 5.7 10*3/uL (ref 4.0–10.5)

## 2018-10-23 LAB — TSH: TSH: 1.07 u[IU]/mL (ref 0.35–4.50)

## 2018-10-23 LAB — HEMOGLOBIN A1C: Hgb A1c MFr Bld: 5.9 % (ref 4.6–6.5)

## 2018-10-23 LAB — VITAMIN B12: Vitamin B-12: >1500 pg/mL — ABNORMAL HIGH (ref 211–911)

## 2018-10-23 LAB — VITAMIN D 25 HYDROXY (VIT D DEFICIENCY, FRACTURES): VITD: 14.91 ng/mL — ABNORMAL LOW (ref 30.00–100.00)

## 2018-10-23 MED ORDER — VITAMIN D (ERGOCALCIFEROL) 1.25 MG (50000 UNIT) PO CAPS: 50000.0000 [IU] | ORAL_CAPSULE | ORAL | 0 refills | Status: AC

## 2018-10-23 NOTE — Addendum Note (Signed)
Addended by: Elmer Picker on: 10/23/2018 10:33 AM   Modules accepted: Orders

## 2018-10-23 NOTE — Progress Notes (Signed)
Established Patient Office Visit     CC/Reason for Visit: Establish care, discuss acute complaints  HPI: Diane Kirk is a 43 y.o. female who is coming in today for the above mentioned reasons. Past Medical History is significant for: Impaired glucose tolerance and gestational diabetes, history of hyperbilirubinemia.  She is currently breast-feeding her 42-year-old.  She has been complaining of diffuse muscle and joint aches, unexpected weight loss, and sporadic numbness and tingling of all 4 extremities.  She also had anemia during her pregnancy, when last checked in July 2019 her hemoglobin was 8.9.    Past Medical/Surgical History: Past Medical History:  Diagnosis Date  . Anxiety   . Complication of anesthesia    epidural did not work  . Gastroesophageal reflux disease   . Gestational diabetes    glyburide  . Hemoglobin C trait (HCC)   . Migraine headache   . Psoriasis     Past Surgical History:  Procedure Laterality Date  . APPENDECTOMY    . BREAST CYST EXCISION    . ivf    . laproscopy    . REFRACTIVE SURGERY Bilateral   . sebaceous cyst resection     Scalp    Social History:  reports that she has never smoked. She has never used smokeless tobacco. She reports that she does not drink alcohol or use drugs.  Allergies: Allergies  Allergen Reactions  . Azithromycin Other (See Comments)    Vertigo   . Tape Rash    Creates a bumpy rash    Family History:  Family History  Problem Relation Age of Onset  . Hypertension Mother   . Hyperlipidemia Mother   . Hypertension Father   . Hyperlipidemia Father   . Liver cancer Father        mets to colon and pancreas  . Diabetes Paternal Aunt   . Kidney disease Paternal Aunt   . Heart disease Paternal Uncle [id number]  . Breast cancer Maternal Grandmother   . Heart disease Maternal Grandfather      Current Outpatient Medications:  .  ibuprofen (ADVIL,MOTRIN) 600 MG tablet, Take 1 tablet (600 mg total) by mouth  every 6 (six) hours., Disp: 30 tablet, Rfl: 0  Review of Systems:  Constitutional: Denies fever, chills, diaphoresis, appetite change and fatigue.  HEENT: Denies photophobia, eye pain, redness, hearing loss, ear pain, congestion, sore throat, rhinorrhea, sneezing, mouth sores, trouble swallowing, neck pain, neck stiffness and tinnitus.   Respiratory: Denies SOB, DOE, cough, chest tightness,  and wheezing.   Cardiovascular: Denies chest pain, palpitations and leg swelling.  Gastrointestinal: Denies nausea, vomiting, abdominal pain, diarrhea, constipation, blood in stool and abdominal distention.  Genitourinary: Denies dysuria, urgency, frequency, hematuria, flank pain and difficulty urinating.  Endocrine: Denies: hot or cold intolerance, sweats, changes in hair or nails, polyuria, polydipsia. Musculoskeletal: Positive for myalgias, back pain, joint swelling, arthralgias Skin: Denies pallor, rash and wound.  Neurological: Denies dizziness, seizures, syncope, weakness, light-headedness, numbness and headaches.  Hematological: Denies adenopathy. Easy bruising, personal or family bleeding history  Psychiatric/Behavioral: Denies suicidal ideation, mood changes, confusion, nervousness, sleep disturbance and agitation    Physical Exam: Vitals:   10/23/18 0958  BP: 110/80  Pulse: 78  Temp: 99 F (37.2 C)  TempSrc: Temporal  SpO2: 98%  Weight: 173 lb 1.6 oz (78.5 kg)  Height: 5\' 10"  (1.778 m)    Body mass index is 24.84 kg/m.   Constitutional: NAD, calm, comfortable Eyes: PERRL, lids and conjunctivae normal ENMT:  Mucous membranes are moist. Posterior pharynx clear of any exudate or lesions. Normal dentition. Tympanic membrane is pearly white, no erythema or bulging. Neck: normal, supple, no masses, no thyromegaly Respiratory: clear to auscultation bilaterally, no wheezing, no crackles. Normal respiratory effort. No accessory muscle use.  Cardiovascular: Regular rate and rhythm, no  murmurs / rubs / gallops. No extremity edema. 2+ pedal pulses. No carotid bruits.  Abdomen: no tenderness, no masses palpated. No hepatosplenomegaly. Bowel sounds positive.  Musculoskeletal: no clubbing / cyanosis. No joint deformity upper and lower extremities. Good ROM, no contractures. Normal muscle tone.  Neurologic: CN 2-12 grossly intact. Sensation intact, DTR normal. Strength 5/5 in all 4.  Psychiatric: Normal judgment and insight. Alert and oriented x 3. Normal mood.    Impression and Plan:  Numbness and tingling  Myalgia  -Will need to rule out hypothyroidism, vitamin B12 and vitamin D deficiency. -She may have increased nutritional deficiencies as she is currently breast-feeding, this may also explain her unintentional weight loss of about 10 pounds the past 6 months.  IGT (impaired glucose tolerance)  -Check A1c today.  Anemia affecting pregnancy, antepartum  -Hemoglobin was last checked at 8.38-month after delivery. -Repeat hemoglobin today as it may be playing a role in her numbness and tingling.  Hyperbilirubinemia  -check LFTs today.    Patient Instructions  -Nice meeting you today!!  -Lab work today; will notify you once results are available.  -Schedule follow up in 6 months.     Lelon Frohlich, MD [facility]

## 2018-10-23 NOTE — Patient Instructions (Signed)
-  Nice meeting you today!!  -Lab work today; will notify you once results are available.  -Schedule follow up in 6 months.

## 2018-10-24 ENCOUNTER — Other Ambulatory Visit: Payer: Self-pay | Admitting: Internal Medicine

## 2018-10-24 DIAGNOSIS — E559 Vitamin D deficiency, unspecified: Secondary | ICD-10-CM

## 2018-10-24 NOTE — Progress Notes (Signed)
Labs ordered.

## 2018-12-12 ENCOUNTER — Ambulatory Visit: Payer: No Typology Code available for payment source | Admitting: Gastroenterology

## 2018-12-12 ENCOUNTER — Encounter: Payer: Self-pay | Admitting: Gastroenterology

## 2018-12-12 VITALS — BP 100/70 | HR 76 | Temp 98.2°F | Ht 69.0 in | Wt 169.4 lb

## 2018-12-12 DIAGNOSIS — R195 Other fecal abnormalities: Secondary | ICD-10-CM | POA: Diagnosis not present

## 2018-12-12 DIAGNOSIS — Z800 Family history of malignant neoplasm of digestive organs: Secondary | ICD-10-CM

## 2018-12-12 MED ORDER — PEG 3350-KCL-NA BICARB-NACL 420 G PO SOLR: 4000.0000 mL | ORAL | 0 refills | Status: DC

## 2018-12-12 NOTE — Progress Notes (Signed)
HPI: This is a very pleasant 43 year old woman  who was referred to me by Isaac Bliss, Estel*  to evaluate family history of colon cancer.    Chief complaint is family history colon cancer, irregular bowels  I actually saw her about 2 years ago for an unrelated problem.  She was sent to me for elevated liver tests.  She was having some minor changes in her bowel habits as well.  At that time she was 3 or [redacted] weeks pregnant.  She had been on multiple antibiotics in the past few months for sinus infections.  I felt that her bowel habits were likely because she was pregnant and had been on antibiotics recently.  I did stool testing and this showed she was negative for C. difficile, routine stool culture, routine ova parasites, fecal leukocytes.  At that time she told me her father had liver cancer that involved the colon in the pancreas.  She had an isolated elevated total bilirubin.  I repeated liver testing on her as well as had a battery of other blood tests and an ultrasound and all of those were normal.  Her liver tests completely normalized.  I recommended repeat LFTs 2 months from that time.  She never had that done.  I have not heard from her since.  She found out since her last visit here that her father actually had colon cancer.  It had metastasized early to his liver and pancreas.  He did not survive it.  He was diagnosed in his early 1s.  She has alternating bowel habits for quite a long time.  She can have loose watery stools and then she can be bothered by constipation and the need to push and strain.  She has seen some minor bleeding at times.  She feels like she has a hemorrhoid that prolapses when she strains.  She sees that her stools are narrow at times, thick at times.  Old Data Reviewed:  Blood work July 2020 shows completely normal c-Met, her vitamin D level was low.  Hemoglobin was 13, MCV 71.5, normal platelets.  Normal TSH.   Review of systems: Pertinent positive and  negative review of systems were noted in the above HPI section. All other review negative.   Past Medical History:  Diagnosis Date  . Anxiety   . Complication of anesthesia    epidural did not work  . Gastroesophageal reflux disease   . Gestational diabetes    glyburide  . Hemoglobin C trait ([place])   . Migraine headache   . Psoriasis     Past Surgical History:  Procedure Laterality Date  . APPENDECTOMY    . BREAST CYST EXCISION    . ivf    . laproscopy    . REFRACTIVE SURGERY Bilateral   . sebaceous cyst resection     Scalp    Current Outpatient Medications  Medication Sig Dispense Refill  . ibuprofen (ADVIL,MOTRIN) 600 MG tablet Take 1 tablet (600 mg total) by mouth every 6 (six) hours. 30 tablet 0  . methylPREDNISolone (MEDROL DOSEPAK) 4 MG TBPK tablet taper from 4 doses each day to 1 dose and stop.    . Vitamin D, Ergocalciferol, (DRISDOL) 1.25 MG (50000 UT) CAPS capsule Take 1 capsule (50,000 Units total) by mouth every 7 (seven) days for 12 doses. 12 capsule 0   No current facility-administered medications for this visit.     Allergies as of 12/12/2018 - Review Complete 12/12/2018  Allergen Reaction Noted  .  Azithromycin Other (See Comments) 04/12/2012  . Tape Rash 07/08/2016    Family History  Problem Relation Age of Onset  . Hypertension Mother   . Hyperlipidemia Mother   . Hypertension Father   . Hyperlipidemia Father   . Liver cancer Father        mets to colon and pancreas  . Diabetes Paternal Aunt   . Kidney disease Paternal Aunt   . Heart disease Paternal Uncle 79  . Breast cancer Maternal Grandmother   . Heart disease Maternal Grandfather     Social History   Socioeconomic History  . Marital status: Married    Spouse name: Not on file  . Number of children: 2  . Years of education: Not on file  . Highest education level: Not on file  Occupational History  . Occupation: homemaker  Social Needs  . Financial resource strain: Not on file  .  Food insecurity    Worry: Not on file    Inability: Not on file  . Transportation needs    Medical: Not on file    Non-medical: Not on file  Tobacco Use  . Smoking status: Never Smoker  . Smokeless tobacco: Never Used  Substance and Sexual Activity  . Alcohol use: No  . Drug use: No  . Sexual activity: Yes    Birth control/protection: Other-see comments    Comment: unsure at this time  Lifestyle  . Physical activity    Days per week: Not on file    Minutes per session: Not on file  . Stress: Not on file  Relationships  . Social Herbalist on phone: Not on file    Gets together: Not on file    Attends religious service: Not on file    Active member of club or organization: Not on file    Attends meetings of clubs or organizations: Not on file    Relationship status: Not on file  . Intimate partner violence    Fear of current or ex partner: Not on file    Emotionally abused: Not on file    Physically abused: Not on file    Forced sexual activity: Not on file  Other Topics Concern  . Not on file  Social History Narrative   Work or School: stays at home      Home Situation: lives with husband and daughter 31 yo ([place]) in 2016      Spiritual Beliefs: Christian      Lifestyle: no regular exercise; diet is ok        Physical Exam: BP 100/70 (BP Location: Left Arm, Patient Position: Sitting, Cuff Size: Normal)   Pulse 76   Temp 98.2 F (36.8 C)   Ht 5' 9"  (1.753 m)   Wt 169 lb 6 oz (76.8 kg)   Breastfeeding Yes   BMI 25.01 kg/m  Constitutional: generally well-appearing Psychiatric: alert and oriented x3 Eyes: extraocular movements intact Mouth: oral pharynx moist, no lesions Neck: supple no lymphadenopathy Cardiovascular: heart regular rate and rhythm Lungs: clear to auscultation bilaterally Abdomen: soft, nontender, nondistended, no obvious ascites, no peritoneal signs, normal bowel sounds Extremities: no lower extremity edema bilaterally Skin:  no lesions on visible extremities   Assessment and plan: 43 y.o. female with family history of colon cancer, alternating bowel habits  She needs a colonoscopy for significant family history of colon cancer.  Her father died with metastatic colon cancer in his early 110s.  I think her alternating bowel habits  are likely functional, possibly dietary related.  I recommended a trial of fiber supplements to see if that can help even out her bowels.  I see no reason for any further blood tests or imaging studies prior to a colonoscopy.  She is breast-feeding and we discussed "pumping and dumping" for 24 hours following the procedure.   Please see the "Patient Instructions" section for addition details about the plan.   Owens Loffler, MD [facility] 12/12/2018, 9:05 AM  Cc: Isaac Bliss, Estel*

## 2018-12-12 NOTE — Patient Instructions (Signed)
You have been scheduled for a colonoscopy. Please follow written instructions given to you at your visit today.  Please pick up your prep supplies at the pharmacy within the next 1-3 days. If you use inhalers (even only as needed), please bring them with you on the day of your procedure. Your physician has requested that you go to www.startemmi.com and enter the access code given to you at your visit today. This web site gives a general overview about your procedure. However, you should still follow specific instructions given to you by our office regarding your preparation for the procedure.   PUMP AND DUMP YOUR BREAST MILK FOR 8 HOURS AFTER YOUR PROCEDURE  Please start taking Citrucel(orange flavored) powder supplement.This may cause some bloating at first,but that usually goes away.Begin with a small spoonful and work your way up to a large,heaping spoonful daily over a week  Thank you for entrusting me with your care and choosing [facility].  Dr Ardis Hughs

## 2018-12-13 ENCOUNTER — Ambulatory Visit: Payer: Self-pay

## 2018-12-13 NOTE — Telephone Encounter (Signed)
Incoming call from Patient with a complaint of feeling dizzy /vertigo.   Rates it Moderate .  Patient is currently breast feeding. Report that she is probably not drinking enough water .  Onset was this morning.  Gets worse when Patient sits up.   Heart rate was 62 during the call. Last occurrence was last year.  Denies any other Sx.    Review protocol with Patient States that she will Probably go to Urgent Care.   Today or tomorrow.     Reason for Disposition . [1] MODERATE dizziness (e.g., interferes with normal activities) AND [2] has NOT been evaluated by physician for this  (Exception: dizziness caused by heat exposure, sudden standing, or poor fluid intake)  Answer Assessment - Initial Assessment Questions 1. DESCRIPTION: "Describe your dizziness."      2. LIGHTHEADED: "Do you feel lightheaded?" (e.g., somewhat faint, woozy, weak upon standing)     *No Answer* 3. VERTIGO: "Do you feel like either you or the room is spinning or tilting?" (i.e. vertigo)     vertigo 4. SEVERITY: "How bad is it?"  "Do you feel like you are going to faint?" "Can you stand and walk?"   - MILD - walking normally   - MODERATE - interferes with normal activities (e.g., work, school)    - SEVERE - unable to stand, requires support to walk, feels like passing out now.     moderate 5. ONSET:  "When did the dizziness begin?"     This morning  6. AGGRAVATING FACTORS: "Does anything make it worse?" (e.g., standing, change in head position)     When I sit 7. HEART RATE: "Can you tell me your heart rate?" "How many beats in 15 seconds?"  (Note: not all patients can do this)       62 8. CAUSE: "What do you think is causing the dizziness?"     *9. RECURRENT SYMPTOM: "Have you had dizziness before?" If so, ask: "When was the last time?" "What happened that time?"     Last year some time 10. OTHER SYMPTOMS: "Do you have any other symptoms?" (e.g., fever, chest pain, vomiting, diarrhea, bleeding)       denies 11.  PREGNANCY: "Is there any chance you are pregnant?" "When was your last menstrual period?"       Breast feeding  Protocols used: DIZZINESS Heidi Dach

## 2018-12-13 NOTE — Telephone Encounter (Signed)
FYI

## 2018-12-14 ENCOUNTER — Ambulatory Visit
Admission: EM | Admit: 2018-12-14 | Discharge: 2018-12-14 | Disposition: A | Discharge status: Home / Self Care | Payer: No Typology Code available for payment source | Attending: Physician Assistant | Admitting: Physician Assistant

## 2018-12-14 DIAGNOSIS — R42 Dizziness and giddiness: Secondary | ICD-10-CM | POA: Diagnosis not present

## 2018-12-14 MED ORDER — MECLIZINE HCL 25 MG PO TABS: 25.0000 mg | ORAL_TABLET | Freq: Three times a day (TID) | ORAL | 0 refills | Status: DC | PRN

## 2018-12-14 MED ORDER — AZELASTINE HCL 0.15 % NA SOLN: 2.0000 | Freq: Every day | NASAL | 0 refills | Status: DC

## 2018-12-14 MED ORDER — TRIAMCINOLONE ACETONIDE 55 MCG/ACT NA AERO
2.0000 | INHALATION_SPRAY | Freq: Every day | NASAL | 0 refills | Status: DC
Start: 2018-12-14 — End: 2019-09-30

## 2018-12-14 NOTE — ED Provider Notes (Signed)
[facility]    CSN: [id number] Arrival date & time: 12/14/18  1648      History   Chief Complaint Chief Complaint  Patient presents with  . Dizziness    HPI Diane Kirk is a 43 y.o. female.   43 yo female presents with bilateral ear fullness x5 days. Has post nasal drip. Denies pain or drainage. Denies cough, fever, or chills. Denies rhinorrhea, nasal congestion, sore throat. Has hx of seasonal allergies but does not take medication for allergies.   She developed dizziness 3 days ago that is intermittent, mostly with movement and positional changes. Yesterday she reports stumbling to the left side and "spinning" when standing, and found it hard to get out of bed. Reports dizziness has improved today. Feels off balance but is able to walk without difficulty. Denies head injury. Denies tinnitus. Denies headache, chest pain or SOB. Denies changes in mental status or one sided weakness.   She has been taking a steroid pack for 5 days that was prescribed by her orthopedic for neck pain. She will complete the course tomorrow. She has not found this to be helpful for her ear fullness/dizziness. She is also currently breastfeeding her 42 year old.      Past Medical History:  Diagnosis Date  . Anxiety   . Complication of anesthesia    epidural did not work  . Gastroesophageal reflux disease   . Gestational diabetes    glyburide  . Hemoglobin C trait ([facility])   . Migraine headache   . Psoriasis     Patient Active Problem List   Diagnosis Date Noted  . IGT (impaired glucose tolerance) 10/23/2018  . Anemia affecting pregnancy 10/23/2018  . Vitamin D deficiency 10/23/2018  . Gestational diabetes mellitus (GDM) affecting second pregnancy 10/06/2017  . Gestational diabetes mellitus (GDM), antepartum 08/16/2017  . 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 Seasonal allergies 06/09/2014  . Migraine 04/12/2012    Past Surgical History:  Procedure Laterality Date  . APPENDECTOMY    .  BREAST CYST EXCISION    . ivf    . laproscopy    . REFRACTIVE SURGERY Bilateral   . sebaceous cyst resection     Scalp    OB History    Gravida  3   Para  2   Term  2   Preterm      AB  1   Living  2     SAB  1   TAB      Ectopic      Multiple  0   Live Births  2            Home Medications    Prior to Admission medications   Medication Sig Start Date End Date Taking? Authorizing Provider  Azelastine HCl 0.15 % SOLN Place 2 sprays into the nose daily. 12/14/18   Cathie HoopsYu,  V, PA-C  ibuprofen (ADVIL,MOTRIN) 600 MG tablet Take 1 tablet (600 mg total) by mouth every 6 (six) hours. 10/07/17   Clemmons, Elmore GuiseLori A, CNM  meclizine (ANTIVERT) 25 MG tablet Take 1 tablet (25 mg total) by mouth 3 (three) times daily as needed for dizziness. 12/14/18   Cathie HoopsYu,  V, PA-C  methylPREDNISolone (MEDROL DOSEPAK) 4 MG TBPK tablet taper from 4 doses each day to 1 dose and stop. 12/10/18   [provider]  polyethylene glycol-electrolytes (NULYTELY/GOLYTELY) 420 g solution Take 4,000 mLs by mouth as directed. 12/12/18   Rachael FeeJacobs, Daniel P, MD  triamcinolone (NASACORT) 55 MCG/ACT  AERO nasal inhaler Place 2 sprays into the nose daily. 12/14/18   Tasia Catchings,  V, PA-C  Vitamin D, Ergocalciferol, (DRISDOL) 1.25 MG (50000 UT) CAPS capsule Take 1 capsule (50,000 Units total) by mouth every 7 (seven) days for 12 doses. 10/23/18 01/09/19  Erline Hau, MD    Family History Family History  Problem Relation Age of Onset  . Hypertension Mother   . Hyperlipidemia Mother   . Hypertension Father   . Hyperlipidemia Father   . Liver cancer Father        mets to colon and pancreas  . Diabetes Paternal Aunt   . Kidney disease Paternal Aunt   . Heart disease Paternal Uncle 13  . Breast cancer Maternal Grandmother   . Heart disease Maternal Grandfather     Social History Social History   Tobacco Use  . Smoking status: Never Smoker  . Smokeless tobacco: Never Used  Substance Use Topics   . Alcohol use: No  . Drug use: No     Allergies   Azithromycin and Tape   Review of Systems Review of Systems  See HPI.    Physical Exam Triage Vital Signs ED Triage Vitals  Enc Vitals Group     BP 12/14/18 1701 116/75     Pulse Rate 12/14/18 1701 91     Resp 12/14/18 1701 18     Temp 12/14/18 1701 98.5 F (36.9 C)     Temp Source 12/14/18 1701 Oral     SpO2 12/14/18 1701 99 %     Weight --      Height --      Head Circumference --      Peak Flow --      Pain Score 12/14/18 1702 3     Pain Loc --      Pain Edu? --      Excl. in [place]? --    Orthostatic VS for the past 24 hrs:  BP- Lying Pulse- Lying BP- Sitting Pulse- Sitting BP- Standing at 0 minutes Pulse- Standing at 0 minutes  12/14/18 1728 115/79 70 116/80 76 113/77 86    Updated Vital Signs BP 116/75 (BP Location: Left Arm)   Pulse 91   Temp 98.5 F (36.9 C) (Oral)   Resp 18   SpO2 99%   Physical Exam Constitutional:      General: She is not in acute distress.    Appearance: Normal appearance. She is not ill-appearing or toxic-appearing.  HENT:     Head: Normocephalic and atraumatic.     Right Ear: Ear canal and external ear normal. A middle ear effusion is present. Tympanic membrane is not erythematous or bulging.     Left Ear: Tympanic membrane, ear canal and external ear normal. Tympanic membrane is not erythematous or bulging.     Nose: Nose normal.     Mouth/Throat:     Mouth: Mucous membranes are moist.     Pharynx: Oropharynx is clear. No oropharyngeal exudate or posterior oropharyngeal erythema.  Eyes:     Extraocular Movements: Extraocular movements intact.     Right eye: No nystagmus.     Left eye: No nystagmus.     Conjunctiva/sclera: Conjunctivae normal.     Pupils: Pupils are equal, round, and reactive to light.  Neck:     Musculoskeletal: Normal range of motion and neck supple. No neck rigidity.  Cardiovascular:     Rate and Rhythm: Normal rate and regular rhythm.     Heart  sounds: Normal heart sounds.  Pulmonary:     Effort: Pulmonary effort is normal. No respiratory distress.     Breath sounds: Normal breath sounds.  Lymphadenopathy:     Cervical: No cervical adenopathy.  Skin:    General: Skin is warm and dry.  Neurological:     General: No focal deficit present.     Mental Status: She is alert and oriented to person, place, and time.     GCS: GCS eye subscore is 4. GCS verbal subscore is 5. GCS motor subscore is 6.     Cranial Nerves: Cranial nerves are intact.     Sensory: Sensation is intact.     Motor: Motor function is intact.     Coordination: Coordination normal. Heel to Shin Test normal.     Gait: Gait is intact.     Comments: Patient swayed slightly during Romberg testing but did not fall. Ambulating on her own without difficulty.       UC Treatments / Results  Labs (all labs ordered are listed, but only abnormal results are displayed) Labs Reviewed - No data to display  EKG   Radiology No results found.  Procedures Procedures (including critical care time)  Medications Ordered in UC Medications - No data to display  Initial Impression / Assessment and Plan / UC Course  I have reviewed the triage vital signs and the nursing notes.  Pertinent labs & imaging results that were available during my care of the patient were reviewed by me and considered in my medical decision making (see chart for details).     Lower suspicion for central vertigo at this time. ? Eustachian tube dysfunction vs BPPV. Start nasacort as directed daily for 1-2 weeks, can add on azelastine if needed. Discussed use of meclizine for vertigo. Discussed use of meclizine can potentially decrease breast milk production as patient currently breastfeeding 25 month old. Risks and benefits discussed, patient expresses understanding and will use sparingly as needed. Follow up with PCP or ENT if no improvement. Return precautions given.   Final Clinical Impressions(s)  / UC Diagnoses   Final diagnoses:  Vertigo    ED Prescriptions    Medication Sig Dispense Auth. Provider   triamcinolone (NASACORT) 55 MCG/ACT AERO nasal inhaler Place 2 sprays into the nose daily. 16.5 g ,  V, PA-C   meclizine (ANTIVERT) 25 MG tablet Take 1 tablet (25 mg total) by mouth 3 (three) times daily as needed for dizziness. 21 tablet ,  V, PA-C   Azelastine HCl 0.15 % SOLN Place 2 sprays into the nose daily. 30 mL Belinda Fisher, PA-C     PDMP not reviewed this encounter.   Belinda Fisher, PA-C 12/14/18 [phone]

## 2018-12-14 NOTE — Discharge Instructions (Addendum)
No alarming signs on exam. As discussed, possible eustachian tube dysfunction causing symptoms. Start nasacort as directed, if symptoms not improving after 1-2 weeks of daily use, can add on azelastine as directed. Finish prednisone course as prescribed. Meclizine as needed for dizziness. Keep hydrated, urine should be clear to pale yellow in color. Follow up with PCP for further evaluation if symptoms not improving. Go to the ED for further evaluation if experiencing confusion/altered mental status, passing out.

## 2018-12-14 NOTE — ED Triage Notes (Signed)
Pt c/o bilateral ear fullness and pressure with burning to nose since Monday and vertigo started Wednesday.

## 2018-12-27 ENCOUNTER — Encounter: Payer: Self-pay | Admitting: Gastroenterology

## 2019-01-07 ENCOUNTER — Telehealth: Payer: Self-pay | Admitting: Gastroenterology

## 2019-01-07 ENCOUNTER — Encounter: Payer: No Typology Code available for payment source | Admitting: Gastroenterology

## 2019-06-08 IMAGING — DX DG LUMBAR SPINE COMPLETE 4+V
5 series · 5 of 5 positions shown · non-contrast
Comparison: Lumbar spine radiographs March 03, 2015

CLINICAL DATA: Low back pain, intermittent burning sensation for 2
months.

EXAM:
LUMBAR SPINE - COMPLETE 4+ VIEW

[l-spine ap]
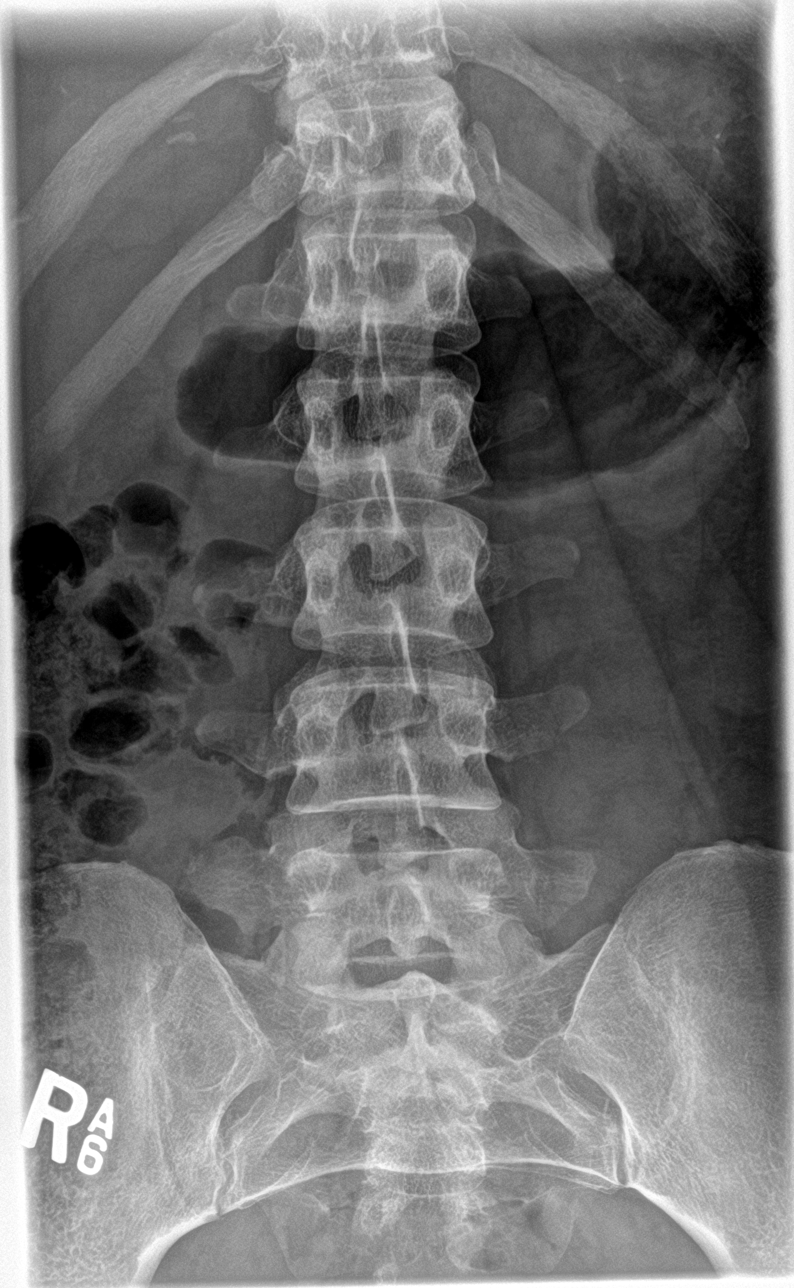

[l-spine obl (1 of 2)]
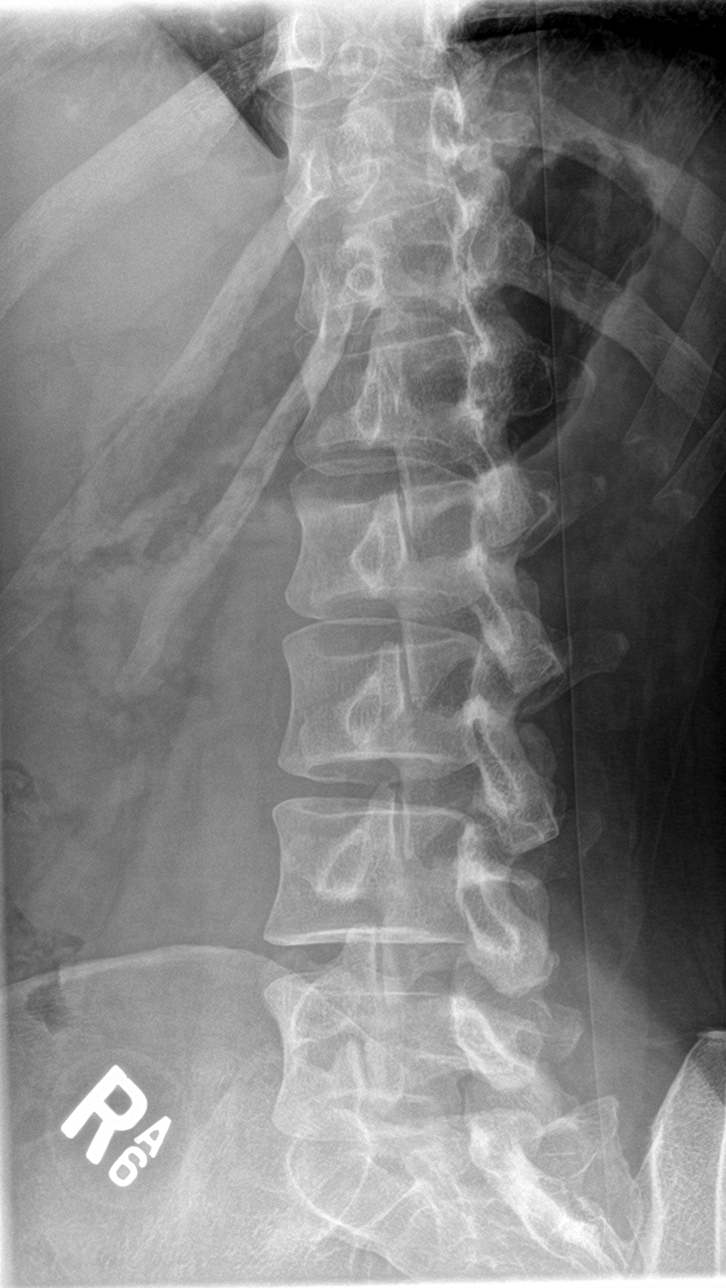

[l-spine obl (2 of 2)]
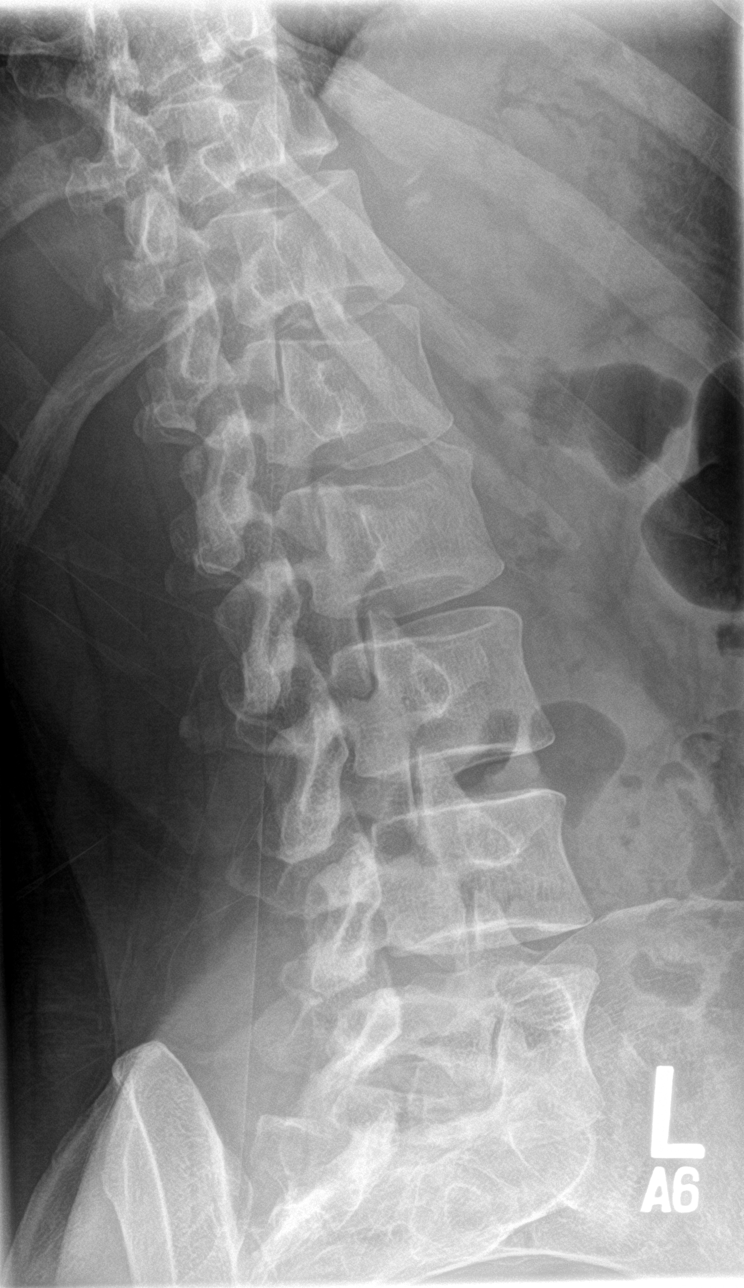

[l-spine lat]
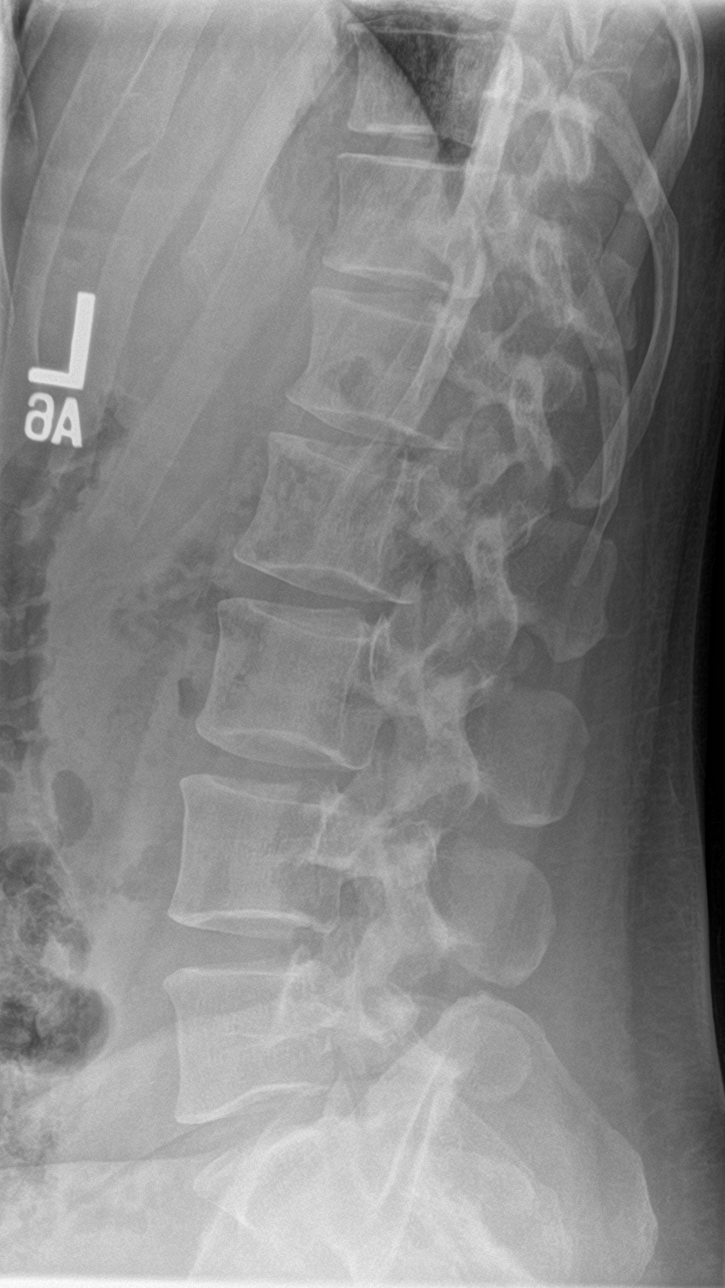

[l-spine spot]
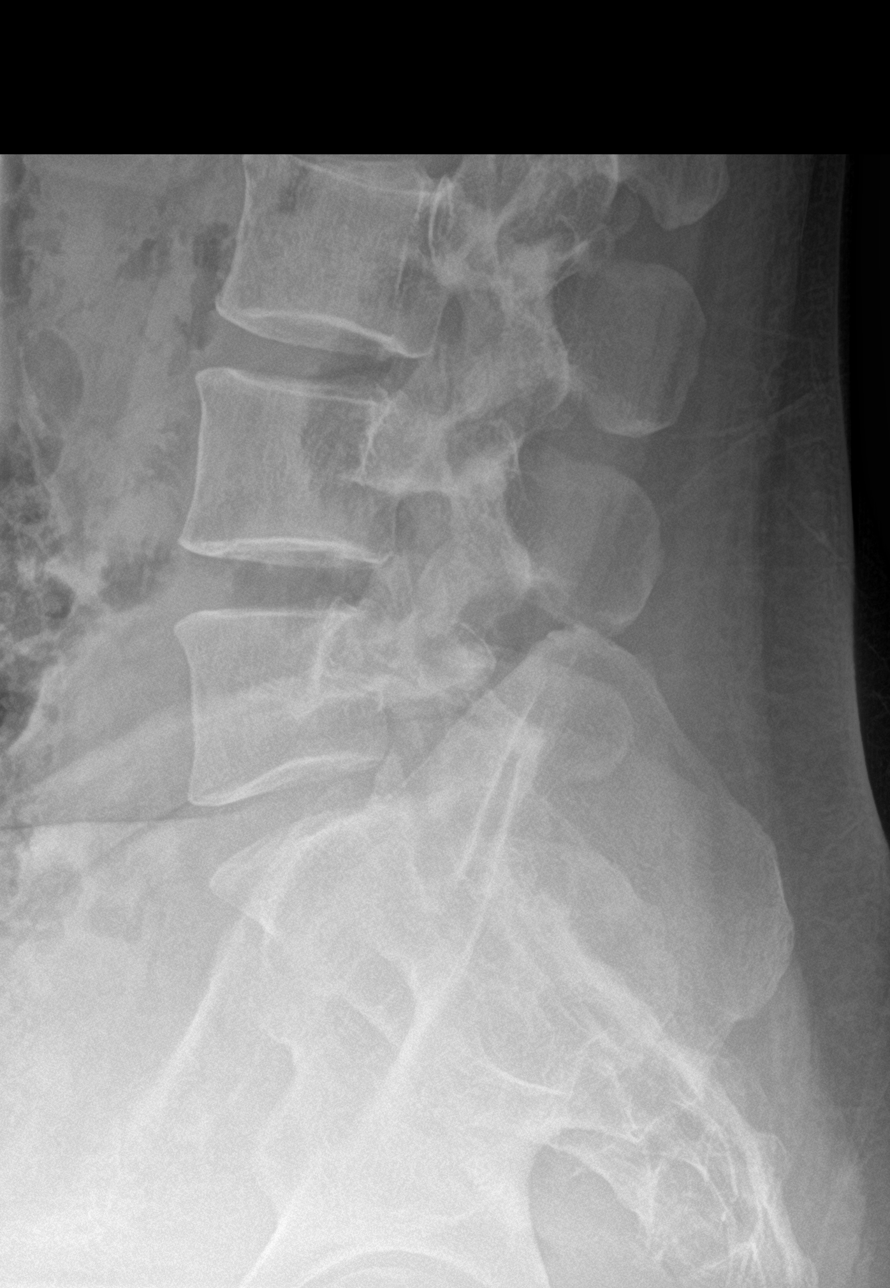

[5 of 5 positions shown; findings below may reference images not displayed]

FINDINGS: Five non rib-bearing lumbar-type vertebral bodies are intact and
aligned with maintenance of the lumbar lordosis. Intervertebral disc
heights are normal. No destructive bony lesions.

Sacroiliac joints are symmetric. Included prevertebral and
paraspinal soft tissue planes are non-suspicious.
IMPRESSION: Stable negative lumbar spine radiographs.

## 2019-09-30 ENCOUNTER — Ambulatory Visit
Admission: EM | Admit: 2019-09-30 | Discharge: 2019-09-30 | Disposition: A | Discharge status: Home / Self Care | Payer: No Typology Code available for payment source | Attending: Physician Assistant | Admitting: Physician Assistant

## 2019-09-30 ENCOUNTER — Encounter: Payer: Self-pay | Admitting: Emergency Medicine

## 2019-09-30 ENCOUNTER — Other Ambulatory Visit: Payer: Self-pay

## 2019-09-30 DIAGNOSIS — J029 Acute pharyngitis, unspecified: Secondary | ICD-10-CM

## 2019-09-30 DIAGNOSIS — R0981 Nasal congestion: Secondary | ICD-10-CM

## 2019-09-30 MED ORDER — LIDOCAINE VISCOUS HCL 2 % MT SOLN: 15.0000 mL | OROMUCOSAL | 0 refills | Status: DC | PRN

## 2019-09-30 MED ORDER — TRIAMCINOLONE ACETONIDE 55 MCG/ACT NA AERO: 2.0000 | INHALATION_SPRAY | Freq: Every day | NASAL | 0 refills | Status: DC

## 2019-09-30 MED ORDER — AZELASTINE HCL 0.15 % NA SOLN: 2.0000 | Freq: Every day | NASAL | 0 refills | Status: DC

## 2019-09-30 NOTE — ED Provider Notes (Signed)
[facility]    CSN: [id number] Arrival date & time: 09/30/19  1140      History   Chief Complaint Chief Complaint  Patient presents with  . Nasal Congestion    HPI Diane Kirk is a 44 y.o. female.   44 year old female comes in for 2 day of URI symptoms. Nasal congestion, sore throat. Denies fever, chills, body aches. Denies abdominal pain, nausea, vomiting, diarrhea. Denies shortness of breath, loss of taste/smell. Daughter with similar symptoms. First pfizer 09/20/2019     Past Medical History:  Diagnosis Date  . Anxiety   . Complication of anesthesia    epidural did not work  . Gastroesophageal reflux disease   . Gestational diabetes    glyburide  . Hemoglobin C trait (HCC)   . Migraine headache   . Psoriasis     Patient Active Problem List   Diagnosis Date Noted  . IGT (impaired glucose tolerance) 10/23/2018  . Anemia affecting pregnancy 10/23/2018  . Vitamin D deficiency 10/23/2018  . Gestational diabetes mellitus (GDM) affecting second pregnancy 10/06/2017  . Gestational diabetes mellitus (GDM), antepartum 08/16/2017  . 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 Seasonal allergies 06/09/2014  . Migraine 04/12/2012    Past Surgical History:  Procedure Laterality Date  . APPENDECTOMY    . BREAST CYST EXCISION    . ivf    . laproscopy    . REFRACTIVE SURGERY Bilateral   . sebaceous cyst resection     Scalp    OB History    Gravida  3   Para  2   Term  2   Preterm      AB  1   Living  2     SAB  1   TAB      Ectopic      Multiple  0   Live Births  2            Home Medications    Prior to Admission medications   Medication Sig Start Date End Date Taking? Authorizing Provider  Azelastine HCl 0.15 % SOLN Place 2 sprays into the nose daily. 09/30/19   Cathie Hoops, Aaminah Forrester V, PA-C  ibuprofen (ADVIL,MOTRIN) 600 MG tablet Take 1 tablet (600 mg total) by mouth every 6 (six) hours. 10/07/17   Clemmons, Lori A, CNM  lidocaine (XYLOCAINE) 2 %  solution Use as directed 15 mLs in the mouth or throat as needed for mouth pain. 09/30/19   Belinda Fisher, PA-C  meclizine (ANTIVERT) 25 MG tablet Take 1 tablet (25 mg total) by mouth 3 (three) times daily as needed for dizziness. 12/14/18   Cathie Hoops, Steph Cheadle V, PA-C  triamcinolone (NASACORT) 55 MCG/ACT AERO nasal inhaler Place 2 sprays into the nose daily. 09/30/19   Belinda Fisher, PA-C    Family History Family History  Problem Relation Age of Onset  . Hypertension Mother   . Hyperlipidemia Mother   . Hypertension Father   . Hyperlipidemia Father   . Liver cancer Father        mets to colon and pancreas  . Diabetes Paternal Aunt   . Kidney disease Paternal Aunt   . Heart disease Paternal Uncle 67  . Breast cancer Maternal Grandmother   . Heart disease Maternal Grandfather     Social History Social History   Tobacco Use  . Smoking status: Never Smoker  . Smokeless tobacco: Never Used  Substance Use Topics  . Alcohol use: No  . Drug use: No  Allergies   Azithromycin and Tape   Review of Systems Review of Systems  Reason unable to perform ROS: See HPI as above.     Physical Exam Triage Vital Signs ED Triage Vitals  Enc Vitals Group     BP 09/30/19 1157 119/79     Pulse Rate 09/30/19 1157 80     Resp 09/30/19 1157 18     Temp 09/30/19 1157 98.4 F (36.9 C)     Temp Source 09/30/19 1157 Oral     SpO2 09/30/19 1157 95 %     Weight --      Height --      Head Circumference --      Peak Flow --      Pain Score 09/30/19 1155 5     Pain Loc --      Pain Edu? --      Excl. in GC? --    No data found.  Updated Vital Signs BP 119/79 (BP Location: Left Arm)   Pulse 80   Temp 98.4 F (36.9 C) (Oral)   Resp 18   LMP 09/20/2019   SpO2 95%   Physical Exam Constitutional:      General: She is not in acute distress.    Appearance: Normal appearance. She is well-developed. She is not ill-appearing, toxic-appearing or diaphoretic.  HENT:     Head: Normocephalic and atraumatic.       Right Ear: Tympanic membrane, ear canal and external ear normal. Tympanic membrane is not erythematous or bulging.     Left Ear: Tympanic membrane, ear canal and external ear normal. Tympanic membrane is not erythematous or bulging.     Nose:     Right Sinus: No maxillary sinus tenderness or frontal sinus tenderness.     Left Sinus: No maxillary sinus tenderness or frontal sinus tenderness.     Mouth/Throat:     Mouth: Mucous membranes are moist.     Pharynx: Oropharynx is clear. Uvula midline.  Eyes:     Conjunctiva/sclera: Conjunctivae normal.     Pupils: Pupils are equal, round, and reactive to light.  Cardiovascular:     Rate and Rhythm: Normal rate and regular rhythm.  Pulmonary:     Effort: Pulmonary effort is normal. No accessory muscle usage, prolonged expiration, respiratory distress or retractions.     Breath sounds: No decreased air movement or transmitted upper airway sounds. No decreased breath sounds.     Comments: LCTAB Musculoskeletal:     Cervical back: Normal range of motion and neck supple.  Skin:    General: Skin is warm and dry.  Neurological:     Mental Status: She is alert and oriented to person, place, and time.      UC Treatments / Results  Labs (all labs ordered are listed, but only abnormal results are displayed) Labs Reviewed  NOVEL CORONAVIRUS, NAA    EKG   Radiology No results found.  Procedures Procedures (including critical care time)  Medications Ordered in UC Medications - No data to display  Initial Impression / Assessment and Plan / UC Course  I have reviewed the triage vital signs and the nursing notes.  Pertinent labs & imaging results that were available during my care of the patient were reviewed by me and considered in my medical decision making (see chart for details).    COVID PCR test ordered. Patient to quarantine until testing results return. No alarming signs on exam.  Patient speaking in full sentences without  respiratory distress.  Symptomatic treatment discussed.  Push fluids.  Return precautions given.  Patient expresses understanding and agrees to plan.  Final Clinical Impressions(s) / UC Diagnoses   Final diagnoses:  Nasal congestion  Sore throat    ED Prescriptions    Medication Sig Dispense Auth. Provider   lidocaine (XYLOCAINE) 2 % solution Use as directed 15 mLs in the mouth or throat as needed for mouth pain. 100 mL Lucky Trotta V, PA-C   Azelastine HCl 0.15 % SOLN Place 2 sprays into the nose daily. 30 mL Yasmeen Manka V, PA-C   triamcinolone (NASACORT) 55 MCG/ACT AERO nasal inhaler Place 2 sprays into the nose daily. 16.5 g Belinda Fisher, PA-C     PDMP not reviewed this encounter.   Belinda Fisher, PA-C 09/30/19 1233

## 2019-09-30 NOTE — ED Triage Notes (Signed)
Patient presents to [facility] for assessment of 2 days of nasal congestion, burning with swallowing, green mucous.  Denies fevers, denies cough.

## 2019-09-30 NOTE — Discharge Instructions (Signed)
COVID PCR testing ordered. I would like you to quarantine until testing results. Start lidocaine for sore throat, do not eat or drink for the next 40 mins after use as it can stunt your gag reflex. Continue nasacort, azelastine for nasal congestion/drainage. You can use over the counter nasal saline rinse such as neti pot for nasal congestion. Keep hydrated, your urine should be clear to pale yellow in color. Tylenol/motrin for fever and pain. Monitor for any worsening of symptoms, chest pain, shortness of breath, wheezing, swelling of the throat, go to the emergency department for further evaluation needed.

## 2019-09-30 NOTE — ED Notes (Signed)
Patient able to ambulate independently  

## 2019-10-02 LAB — NOVEL CORONAVIRUS, NAA: SARS-CoV-2, NAA: NOT DETECTED

## 2019-10-02 LAB — SARS-COV-2, NAA 2 DAY TAT

## 2020-01-13 ENCOUNTER — Other Ambulatory Visit: Payer: Self-pay

## 2020-01-13 ENCOUNTER — Encounter: Payer: Self-pay | Admitting: Family Medicine

## 2020-01-13 ENCOUNTER — Ambulatory Visit: Payer: No Typology Code available for payment source | Admitting: Family Medicine

## 2020-01-13 ENCOUNTER — Ambulatory Visit (INDEPENDENT_AMBULATORY_CARE_PROVIDER_SITE_OTHER): Payer: No Typology Code available for payment source

## 2020-01-13 ENCOUNTER — Encounter: Payer: Self-pay | Admitting: Gastroenterology

## 2020-01-13 VITALS — BP 116/80 | HR 89 | Temp 98.0°F | Ht 70.0 in | Wt 183.3 lb

## 2020-01-13 DIAGNOSIS — R109 Unspecified abdominal pain: Secondary | ICD-10-CM

## 2020-01-13 DIAGNOSIS — R309 Painful micturition, unspecified: Secondary | ICD-10-CM

## 2020-01-13 LAB — POCT URINALYSIS DIP (CLINITEK)
Bilirubin, UA: NEGATIVE
Blood, UA: NEGATIVE
Glucose, UA: NEGATIVE mg/dL
Ketones, POC UA: NEGATIVE mg/dL
Nitrite, UA: NEGATIVE
POC PROTEIN,UA: NEGATIVE
Spec Grav, UA: 1.01 (ref 1.010–1.025)
Urobilinogen, UA: 0.2 E.U./dL
pH, UA: 6.5 (ref 5.0–8.0)

## 2020-01-13 NOTE — Patient Instructions (Signed)
Stay well hydrated  Follow up for any fever or increasing pain  We will call with x-ray results.

## 2020-01-13 NOTE — Progress Notes (Signed)
Established Patient Office Visit  Subjective:  Patient ID: Diane Kirk, female    DOB: 12/09/1975  Age: 44 y.o. MRN: [id number]  CC:  Chief Complaint  Patient presents with  . Back Pain    having alot lower back , pressure when going  urination  x 2 days , taking advail otc for pain , no injury     HPI Kimarie Coor presents for left flank pain and reported recent hematuria on urinalysis.  She saw her gynecologist couple weeks ago and had some hematuria on dipstick.  No gross hematuria.  She states she had a follow-up urine as well that again confirmed some persistent hematuria.  She is not sure if urine culture and/or urine microscopy were done.  She was referred to urologist and has pending appointment in November.  This past Saturday she developed some flank pain around the left kidney.  Has never had any fever.  Pain radiates slightly anterior.  No exacerbating or alleviating factors.  She took ibuprofen 800 mg without improvement.  Not exacerbated by movement.  No history of kidney stones.  No abdominal pain.  No nausea or vomiting.  Past Medical History:  Diagnosis Date  . Anxiety   . Complication of anesthesia    epidural did not work  . Gastroesophageal reflux disease   . Gestational diabetes    glyburide  . Hemoglobin C trait (HCC)   . Migraine headache   . Psoriasis     Past Surgical History:  Procedure Laterality Date  . APPENDECTOMY    . BREAST CYST EXCISION    . ivf    . laproscopy    . REFRACTIVE SURGERY Bilateral   . sebaceous cyst resection     Scalp    Family History  Problem Relation Age of Onset  . Hypertension Mother   . Hyperlipidemia Mother   . Hypertension Father   . Hyperlipidemia Father   . Liver cancer Father        mets to colon and pancreas  . Diabetes Paternal Aunt   . Kidney disease Paternal Aunt   . Heart disease Paternal Uncle 73  . Breast cancer Maternal Grandmother   . Heart disease Maternal Grandfather     Social History    Socioeconomic History  . Marital status: Married    Spouse name: Not on file  . Number of children: 2  . Years of education: Not on file  . Highest education level: Not on file  Occupational History  . Occupation: homemaker  Tobacco Use  . Smoking status: Never Smoker  . Smokeless tobacco: Never Used  Substance and Sexual Activity  . Alcohol use: No  . Drug use: No  . Sexual activity: Yes    Birth control/protection: Other-see comments    Comment: unsure at this time  Other Topics Concern  . Not on file  Social History Narrative   Work or School: stays at home      Home Situation: lives with husband and daughter 24 yo (kendall) in 2016      Spiritual Beliefs: Christian      Lifestyle: no regular exercise; diet is ok      Social Determinants of Corporate investment banker Strain:   . Difficulty of Paying Living Expenses: Not on file  Food Insecurity:   . Worried About Programme researcher, broadcasting/film/video in the Last Year: Not on file  . Ran Out of Food in the Last Year: Not on file  Transportation  Needs:   . Lack of Transportation (Medical): Not on file  . Lack of Transportation (Non-Medical): Not on file  Physical Activity:   . Days of Exercise per Week: Not on file  . Minutes of Exercise per Session: Not on file  Stress:   . Feeling of Stress : Not on file  Social Connections:   . Frequency of Communication with Friends and Family: Not on file  . Frequency of Social Gatherings with Friends and Family: Not on file  . Attends Religious Services: Not on file  . Active Member of Clubs or Organizations: Not on file  . Attends Banker Meetings: Not on file  . Marital Status: Not on file  Intimate Partner Violence:   . Fear of Current or Ex-Partner: Not on file  . Emotionally Abused: Not on file  . Physically Abused: Not on file  . Sexually Abused: Not on file    Outpatient Medications Prior to Visit  Medication Sig Dispense Refill  . ibuprofen (ADVIL,MOTRIN) 600  MG tablet Take 1 tablet (600 mg total) by mouth every 6 (six) hours. 30 tablet 0  . Azelastine HCl 0.15 % SOLN Place 2 sprays into the nose daily. 30 mL 0  . lidocaine (XYLOCAINE) 2 % solution Use as directed 15 mLs in the mouth or throat as needed for mouth pain. 100 mL 0  . meclizine (ANTIVERT) 25 MG tablet Take 1 tablet (25 mg total) by mouth 3 (three) times daily as needed for dizziness. 21 tablet 0  . triamcinolone (NASACORT) 55 MCG/ACT AERO nasal inhaler Place 2 sprays into the nose daily. 16.5 g 0   No facility-administered medications prior to visit.    Allergies  Allergen Reactions  . Azithromycin Other (See Comments)    Vertigo   . Tape Rash    Creates a bumpy rash    ROS Review of Systems  Constitutional: Negative for chills and fever.  Respiratory: Negative for cough and shortness of breath.   Cardiovascular: Negative for chest pain.  Genitourinary: Positive for flank pain. Negative for difficulty urinating, hematuria and pelvic pain.  Musculoskeletal: Positive for back pain.  Hematological: Negative for adenopathy.      Objective:    Physical Exam Vitals reviewed.  Constitutional:      Appearance: Normal appearance.  Cardiovascular:     Rate and Rhythm: Normal rate and regular rhythm.  Pulmonary:     Effort: Pulmonary effort is normal.     Breath sounds: Normal breath sounds.  Abdominal:     General: Bowel sounds are normal. There is no distension.     Palpations: Abdomen is soft. There is no mass.     Tenderness: There is no abdominal tenderness. There is no guarding.  Neurological:     Mental Status: She is alert.     BP 116/80 (BP Location: Left Arm, Patient Position: Sitting, Cuff Size: Normal)   Pulse 89   Temp 98 F (36.7 C)   Ht 5\' 10"  (1.778 m)   Wt 183 lb 4.8 oz (83.1 kg)   LMP 12/21/2019   SpO2 97%   BMI 26.30 kg/m  Wt Readings from Last 3 Encounters:  01/13/20 183 lb 4.8 oz (83.1 kg)  12/12/18 169 lb 6 oz (76.8 kg)  10/23/18 173 lb  1.6 oz (78.5 kg)     Health Maintenance Due  Topic Date Due  . PAP SMEAR-Modifier  07/22/2018  . INFLUENZA VACCINE  Never done    There are no preventive  care reminders to display for this patient.  Lab Results  Component Value Date   TSH 1.07 10/23/2018   Lab Results  Component Value Date   WBC 5.7 10/23/2018   HGB 13.0 10/23/2018   HCT 40.3 10/23/2018   MCV 71.5 (L) 10/23/2018   PLT 219.0 10/23/2018   Lab Results  Component Value Date   NA 140 10/23/2018   K 4.1 10/23/2018   CO2 29 10/23/2018   GLUCOSE 93 10/23/2018   BUN 10 10/23/2018   CREATININE 0.85 10/23/2018   BILITOT 1.2 10/23/2018   ALKPHOS 89 10/23/2018   AST 12 10/23/2018   ALT 7 10/23/2018   PROT 7.6 10/23/2018   ALBUMIN 4.6 10/23/2018   CALCIUM 9.8 10/23/2018   ANIONGAP 10 07/08/2016   GFR 73.46 10/23/2018   Lab Results  Component Value Date   CHOL 141 06/09/2014   Lab Results  Component Value Date   HDL 49.30 06/09/2014   Lab Results  Component Value Date   LDLCALC 81 06/09/2014   Lab Results  Component Value Date   TRIG 56.0 06/09/2014   Lab Results  Component Value Date   CHOLHDL 3 06/09/2014   Lab Results  Component Value Date   HGBA1C 5.9 10/23/2018      Assessment & Plan:   Patient presents with 2-day history of some left flank pain and reported recent hematuria noted on dipstick though none noted today.  She does not have any fever or other suggestion of pyelonephritis.  Urine dipstick today here in office shows moderate leukocytes otherwise negative.  Consider kidney stone given location of pain and radiation anterior and reported history of recent hematuria  -Urine culture sent -Stay well-hydrated -1 view plain abdomen obtained to look for any calcifications to suggest calcium stone.  We explained that plain film may not be sensitive enough to show kidney stone and may need CT nephrogram if pain persists -Keep urology follow-up for now -Follow-up immediately for any  fever or worsening pain.  We offered further analgesics but at this point her pain is fairly tolerable  No orders of the defined types were placed in this encounter.   Follow-up: No follow-ups on file.    Evelena Peat, MD

## 2020-01-14 LAB — URINE CULTURE
MICRO NUMBER:: 11084146
SPECIMEN QUALITY:: ADEQUATE

## 2020-01-14 MED ORDER — TAMSULOSIN HCL 0.4 MG PO CAPS: 0.4000 mg | ORAL_CAPSULE | Freq: Every day | ORAL | 0 refills | Status: DC

## 2020-01-14 NOTE — Progress Notes (Unsigned)
Patient notified.  Possible 3 to 4 mm calculus left ureter.  Patient symptoms are basically unchanged.  We discussed possible trial of Flomax 0.4 mg once daily.  She has been nursing her 43-year-old but is willing to stop at this time as they are already in the process of weaning off.  We do not recommend Flomax if she has nursing that she has stated that she will stop at this point.  If her symptoms are not fully resolved by later this week recommend a CT renal stone study to further assess

## 2020-01-15 ENCOUNTER — Telehealth: Payer: No Typology Code available for payment source | Admitting: Internal Medicine

## 2020-02-10 ENCOUNTER — Other Ambulatory Visit: Payer: Self-pay

## 2020-02-10 ENCOUNTER — Ambulatory Visit (AMBULATORY_SURGERY_CENTER): Payer: Self-pay | Admitting: *Deleted

## 2020-02-10 VITALS — Ht 70.0 in | Wt 178.0 lb

## 2020-02-10 DIAGNOSIS — Z800 Family history of malignant neoplasm of digestive organs: Secondary | ICD-10-CM

## 2020-02-10 NOTE — Progress Notes (Signed)

## 2020-02-13 ENCOUNTER — Encounter: Payer: Self-pay | Admitting: Gastroenterology

## 2020-03-03 ENCOUNTER — Encounter: Payer: Self-pay | Admitting: Gastroenterology

## 2020-03-03 ENCOUNTER — Other Ambulatory Visit: Payer: Self-pay

## 2020-03-03 ENCOUNTER — Ambulatory Visit (AMBULATORY_SURGERY_CENTER): Payer: No Typology Code available for payment source | Admitting: Gastroenterology

## 2020-03-03 VITALS — BP 116/70 | HR 65 | Temp 97.1°F | Resp 16 | Ht 70.0 in | Wt 178.0 lb

## 2020-03-03 DIAGNOSIS — Z800 Family history of malignant neoplasm of digestive organs: Secondary | ICD-10-CM

## 2020-03-03 DIAGNOSIS — Z1211 Encounter for screening for malignant neoplasm of colon: Secondary | ICD-10-CM

## 2020-03-03 MED ORDER — SODIUM CHLORIDE 0.9 % IV SOLN: 500.0000 mL | Freq: Once | INTRAVENOUS | Status: DC

## 2020-03-03 NOTE — Progress Notes (Signed)
Report given to PACU, vss 

## 2020-03-03 NOTE — Patient Instructions (Signed)
Hemorrhoids only today- no polyps or cancers!!!  Next colonoscopy in 5 years due to your family history  Continue your normal medications  YOU HAD AN ENDOSCOPIC PROCEDURE TODAY AT THE  ENDOSCOPY CENTER:   Refer to the procedure report that was given to you for any specific questions about what was found during the examination.  If the procedure report does not answer your questions, please call your gastroenterologist to clarify.  If you requested that your care partner not be given the details of your procedure findings, then the procedure report has been included in a sealed envelope for you to review at your convenience later.  YOU SHOULD EXPECT: Some feelings of bloating in the abdomen. Passage of more gas than usual.  Walking can help get rid of the air that was put into your GI tract during the procedure and reduce the bloating. If you had a lower endoscopy (such as a colonoscopy or flexible sigmoidoscopy) you may notice spotting of blood in your stool or on the toilet paper. If you underwent a bowel prep for your procedure, you may not have a normal bowel movement for a few days.  Please Note:  You might notice some irritation and congestion in your nose or some drainage.  This is from the oxygen used during your procedure.  There is no need for concern and it should clear up in a day or so.  SYMPTOMS TO REPORT IMMEDIATELY:   Following lower endoscopy (colonoscopy or flexible sigmoidoscopy):  Excessive amounts of blood in the stool  Significant tenderness or worsening of abdominal pains  Swelling of the abdomen that is new, acute  Fever of 100F or higher  For urgent or emergent issues, a gastroenterologist can be reached at any hour by calling [phone]. Do not use MyChart messaging for urgent concerns.    DIET:  We do recommend a small meal at first, but then you may proceed to your regular diet.  Drink plenty of fluids but you should avoid alcoholic beverages for 24  hours.  ACTIVITY:  You should plan to take it easy for the rest of today and you should NOT DRIVE or use heavy machinery until tomorrow (because of the sedation medicines used during the test).    FOLLOW UP: Our staff will call the number listed on your records 48-72 hours following your procedure to check on you and address any questions or concerns that you may have regarding the information given to you following your procedure. If we do not reach you, we will leave a message.  We will attempt to reach you two times.  During this call, we will ask if you have developed any symptoms of COVID 19. If you develop any symptoms (ie: fever, flu-like symptoms, shortness of breath, cough etc.) before then, please call [phone].  If you test positive for Covid 19 in the 2 weeks post procedure, please call and report this information to [place].    SIGNATURES/CONFIDENTIALITY: You and/or your care partner have signed paperwork which will be entered into your electronic medical record.  These signatures attest to the fact that that the information above on your After Visit Summary has been reviewed and is understood.  Full responsibility of the confidentiality of this discharge information lies with you and/or your care-partner.

## 2020-03-03 NOTE — Op Note (Signed)
[facility] Patient Name: Diane Kirk Procedure Date: 03/03/2020 8:25 AM MRN: [id number] Endoscopist: Rachael Fee , MD Age: 44 Referring MD:  Date of Birth: Dec 14, 1975 Gender: Female Account #: [id number] Procedure:                Colonoscopy Indications:              Screening in patient at increased risk: Family                            history of 1st-degree relative with colorectal                            cancer (father diagnosed with metastatic colon                            cancer in his early 63s) Medicines:                Monitored Anesthesia Care Procedure:                Pre-Anesthesia Assessment:                           - Prior to the procedure, a History and Physical                            was performed, and patient medications and                            allergies were reviewed. The patient's tolerance of                            previous anesthesia was also reviewed. The risks                            and benefits of the procedure and the sedation                            options and risks were discussed with the patient.                            All questions were answered, and informed consent                            was obtained. Prior Anticoagulants: The patient has                            taken no previous anticoagulant or antiplatelet                            agents. ASA Grade Assessment: II - A patient with                            mild systemic disease. After reviewing the risks  and benefits, the patient was deemed in                            satisfactory condition to undergo the procedure.                           After obtaining informed consent, the colonoscope                            was passed under direct vision. Throughout the                            procedure, the patient's blood pressure, pulse, and                            oxygen saturations were monitored  continuously. The                            Colonoscope was introduced through the anus and                            advanced to the the cecum, identified by                            appendiceal orifice and ileocecal valve. The                            colonoscopy was performed without difficulty. The                            patient tolerated the procedure well. The quality                            of the bowel preparation was good. The ileocecal                            valve, appendiceal orifice, and rectum were                            photographed. Scope In: 8:30:19 AM Scope Out: 8:41:53 AM Scope Withdrawal Time: 0 hours 9 minutes 20 seconds  Total Procedure Duration: 0 hours 11 minutes 34 seconds  Findings:                 External hemorrhoids were found. The hemorrhoids                            were small.                           The exam was otherwise without abnormality on                            direct and retroflexion views. Complications:            No immediate complications. Estimated blood loss:  None. Estimated Blood Loss:     Estimated blood loss: none. Impression:               - External hemorrhoids.                           - The examination was otherwise normal on direct                            and retroflexion views.                           - No polyps or cancers. Recommendation:           - Patient has a contact number available for                            emergencies. The signs and symptoms of potential                            delayed complications were discussed with the                            patient. Return to normal activities tomorrow.                            Written discharge instructions were provided to the                            patient.                           - Resume previous diet.                           - Continue present medications.                           - Repeat  colonoscopy in 5 years for screening. Rachael Fee, MD 03/03/2020 8:44:35 AM This report has been signed electronically.

## 2020-03-03 NOTE — Progress Notes (Signed)
Pt's states no medical or surgical changes since previsit or office visit. 

## 2020-03-05 ENCOUNTER — Telehealth: Payer: Self-pay

## 2020-03-05 NOTE — Telephone Encounter (Signed)
  Follow up Call-  Call back number 03/03/2020  Post procedure Call Back phone  # [phone]  Permission to leave phone message Yes  Some recent data might be hidden     Patient questions:  Do you have a fever, pain , or abdominal swelling? No. Pain Score  0 *  Have you tolerated food without any problems? Yes.    Have you been able to return to your normal activities? Yes.    Do you have any questions about your discharge instructions: Diet   No. Medications  No. Follow up visit  No.  Do you have questions or concerns about your Care? No.  Actions: * If pain score is 4 or above: 1. No action needed, pain <4.Have you developed a fever since your procedure? no  2.   Have you had an respiratory symptoms (SOB or cough) since your procedure? no  3.   Have you tested positive for COVID 19 since your procedure no  4.   Have you had any family members/close contacts diagnosed with the COVID 19 since your procedure?  no   If yes to any of these questions please route to Laverna Peace, RN and Karlton Lemon, RN

## 2020-05-15 ENCOUNTER — Ambulatory Visit: Payer: No Typology Code available for payment source | Admitting: Internal Medicine

## 2020-05-20 ENCOUNTER — Encounter: Payer: Self-pay | Admitting: Internal Medicine

## 2020-05-20 ENCOUNTER — Ambulatory Visit: Payer: No Typology Code available for payment source | Admitting: Internal Medicine

## 2020-05-20 ENCOUNTER — Other Ambulatory Visit: Payer: Self-pay

## 2020-05-20 VITALS — BP 124/84 | HR 74 | Temp 98.3°F | Wt 187.0 lb

## 2020-05-20 DIAGNOSIS — R7302 Impaired glucose tolerance (oral): Secondary | ICD-10-CM | POA: Diagnosis not present

## 2020-05-20 DIAGNOSIS — R319 Hematuria, unspecified: Secondary | ICD-10-CM

## 2020-05-20 DIAGNOSIS — R519 Headache, unspecified: Secondary | ICD-10-CM

## 2020-05-20 DIAGNOSIS — R000 Tachycardia, unspecified: Secondary | ICD-10-CM | POA: Diagnosis not present

## 2020-05-20 MED ORDER — METOPROLOL TARTRATE 25 MG PO TABS: 12.5000 mg | ORAL_TABLET | Freq: Two times a day (BID) | ORAL | 2 refills | Status: DC

## 2020-05-20 NOTE — Progress Notes (Signed)
Established Patient Office Visit     This visit occurred during the SARS-CoV-2 public health emergency.  Safety protocols were in place, including screening questions prior to the visit, additional usage of staff PPE, and extensive cleaning of exam room while observing appropriate contact time as indicated for disinfecting solutions.    CC/Reason for Visit: Discuss headaches, fast heartbeat  HPI: Diane Kirk is a 45 y.o. female who is coming in today for the above mentioned reasons. Past Medical History is significant for: Previous history of migraine headaches diagnosed in 2004 who had been seen by neurology with significant improvement, no longer on preventive or abortive medications.  She states for the last 2 weeks she has had a bilateral headache right above her eyes and on the top of her head.  When she bends over it feels worse.  She does have some photosensitivity.  She has been taking Excedrin once daily without relief.  She just had an eye appointment today and everything was okay.  Interestingly, her husband and her 2 children were diagnosed with Covid in early February.  She did one home test that was negative and has not retested.  Once or twice a month she will feel fluttering in her chest and lightheadedness.  She has an apple watch.  During these times it will record a heart rate of over 200.  She has showed me her recordings today.  It appears to be some sort of regular, narrow complex tachycardia.  She had 2 episodes in February: 2/1 and 2/8.  Other episodes that are recorded are on 11/10 and 9/14.   Past Medical/Surgical History: Past Medical History:  Diagnosis Date  . Anxiety   . Complication of anesthesia    epidural did not work  . Gastroesophageal reflux disease   . Gestational diabetes    glyburide  . Hemoglobin C trait (HCC)   . Migraine headache   . Psoriasis     Past Surgical History:  Procedure Laterality Date  . APPENDECTOMY    . BREAST CYST  EXCISION    . ivf    . laproscopy    . REFRACTIVE SURGERY Bilateral   . sebaceous cyst resection     Scalp    Social History:  reports that she has never smoked. She has never used smokeless tobacco. She reports that she does not drink alcohol and does not use drugs.  Allergies: Allergies  Allergen Reactions  . Azithromycin Other (See Comments)    Vertigo   . Tape Rash    Creates a bumpy rash    Family History:  Family History  Problem Relation Age of Onset  . Hypertension Mother   . Hyperlipidemia Mother   . Hypertension Father   . Hyperlipidemia Father   . Liver cancer Father        mets to colon and pancreas  . Colon cancer Father   . Diabetes Paternal Aunt   . Kidney disease Paternal Aunt   . Heart disease Paternal Uncle 98  . Breast cancer Maternal Grandmother   . Heart disease Maternal Grandfather   . Esophageal cancer Neg Hx   . Stomach cancer Neg Hx   . Rectal cancer Neg Hx      Current Outpatient Medications:  .  ibuprofen (ADVIL,MOTRIN) 600 MG tablet, Take 1 tablet (600 mg total) by mouth every 6 (six) hours., Disp: 30 tablet, Rfl: 0  Review of Systems:  Constitutional: Denies fever, chills, diaphoresis, appetite change and fatigue.  HEENT: Denies photophobia, eye pain, redness, hearing loss, ear pain, congestion, sore throat, rhinorrhea, sneezing, mouth sores, trouble swallowing, neck pain, neck stiffness and tinnitus.   Respiratory: Denies SOB, DOE, cough, chest tightness,  and wheezing.   Cardiovascular: Denies chest pain and leg swelling.  Gastrointestinal: Denies nausea, vomiting, abdominal pain, diarrhea, constipation, blood in stool and abdominal distention.  Genitourinary: Denies dysuria, urgency, frequency, hematuria, flank pain and difficulty urinating.  Endocrine: Denies: hot or cold intolerance, sweats, changes in hair or nails, polyuria, polydipsia. Musculoskeletal: Denies myalgias, back pain, joint swelling, arthralgias and gait problem.   Skin: Denies pallor, rash and wound.  Neurological: Denies dizziness, seizures, syncope, weakness, light-headedness, numbness.  Hematological: Denies adenopathy. Easy bruising, personal or family bleeding history  Psychiatric/Behavioral: Denies suicidal ideation, mood changes, confusion, nervousness, sleep disturbance and agitation    Physical Exam: Vitals:   05/20/20 1337  BP: 124/84  Pulse: 74  Temp: 98.3 F (36.8 C)  TempSrc: Oral  SpO2: 97%  Weight: 187 lb (84.8 kg)    Body mass index is 26.83 kg/m.   Constitutional: NAD, calm, comfortable Eyes: PERRL, lids and conjunctivae normal ENMT: Mucous membranes are moist.  Respiratory: clear to auscultation bilaterally, no wheezing, no crackles. Normal respiratory effort. No accessory muscle use.  Cardiovascular: Regular rate and rhythm, no murmurs / rubs / gallops. No extremity edema.  Neurologic: Grossly intact and nonfocal. Psychiatric: Normal judgment and insight. Alert and oriented x 3. Normal mood.    Impression and Plan:  Sinus headache -By description sounds like a sinus headache. -Have advised use of NSAIDs as well as Mucinex twice daily for 7 to 10 days. -Does not sound like a classical migraine. -She will follow-up with me if no improvement.  Tachycardia  - Plan: CBC with Differential/Platelet, Comprehensive metabolic panel, TSH -The recordings on her watch appear to be some sort of narrow complex tachycardia that is regular (?PSVT).  Heart rates are frequently in the 200s during these episodes. -Since episodes only happen once every 2 months on average, not sure if a 30-day event monitor will be helpful. -I will send her to see cardiology.  I will start her on a low-dose beta-blocker.  IGT (impaired glucose tolerance)  - Plan: Hemoglobin A1c   Patient Instructions  -Nice seeing you today!!  -Lab work today; will notify you once results are available.  -Referral to cardiology today.  -May use  ibuprofen and mucinex twice daily for about 10 days for your headache. Let me know if no improvement. Would suggest COVID testing as well.     Chaya Jan, MD [facility] at [facility]

## 2020-05-20 NOTE — Patient Instructions (Signed)
-  Nice seeing you today!!  -Lab work today; will notify you once results are available.  -Referral to cardiology today.  -May use ibuprofen and mucinex twice daily for about 10 days for your headache. Let me know if no improvement. Would suggest COVID testing as well.

## 2020-05-21 ENCOUNTER — Other Ambulatory Visit: Payer: Self-pay | Admitting: Internal Medicine

## 2020-05-21 DIAGNOSIS — B373 Candidiasis of vulva and vagina: Secondary | ICD-10-CM

## 2020-05-21 DIAGNOSIS — B3731 Acute candidiasis of vulva and vagina: Secondary | ICD-10-CM

## 2020-05-21 LAB — CBC WITH DIFFERENTIAL/PLATELET
Basophils Absolute: 0.1 10*3/uL (ref 0.0–0.1)
Basophils Relative: 0.9 % (ref 0.0–3.0)
Eosinophils Absolute: 0 10*3/uL (ref 0.0–0.7)
Eosinophils Relative: 0.7 % (ref 0.0–5.0)
HCT: 37.2 % (ref 36.0–46.0)
Hemoglobin: 12.2 g/dL (ref 12.0–15.0)
Lymphocytes Relative: 33.5 % (ref 12.0–46.0)
Lymphs Abs: 2.2 10*3/uL (ref 0.7–4.0)
MCHC: 32.8 g/dL (ref 30.0–36.0)
MCV: 72 fl — ABNORMAL LOW (ref 78.0–100.0)
Monocytes Absolute: 0.4 10*3/uL (ref 0.1–1.0)
Monocytes Relative: 5.4 % (ref 3.0–12.0)
Neutro Abs: 4 10*3/uL (ref 1.4–7.7)
Neutrophils Relative %: 59.5 % (ref 43.0–77.0)
Platelets: 214 10*3/uL (ref 150.0–400.0)
RBC: 5.17 Mil/uL — ABNORMAL HIGH (ref 3.87–5.11)
RDW: 15 % (ref 11.5–15.5)
WBC: 6.7 10*3/uL (ref 4.0–10.5)

## 2020-05-21 LAB — URINALYSIS, ROUTINE W REFLEX MICROSCOPIC
Bilirubin Urine: NEGATIVE
Ketones, ur: NEGATIVE
Nitrite: NEGATIVE
Specific Gravity, Urine: 1.01 (ref 1.000–1.030)
Total Protein, Urine: NEGATIVE
Urine Glucose: NEGATIVE
Urobilinogen, UA: 0.2 (ref 0.0–1.0)
pH: 7.5 (ref 5.0–8.0)

## 2020-05-21 LAB — TSH: TSH: 1.01 u[IU]/mL (ref 0.35–4.50)

## 2020-05-21 LAB — COMPREHENSIVE METABOLIC PANEL
ALT: 11 U/L (ref 0–35)
AST: 16 U/L (ref 0–37)
Albumin: 4.2 g/dL (ref 3.5–5.2)
Alkaline Phosphatase: 49 U/L (ref 39–117)
BUN: 8 mg/dL (ref 6–23)
CO2: 26 mEq/L (ref 19–32)
Calcium: 9.4 mg/dL (ref 8.4–10.5)
Chloride: 104 mEq/L (ref 96–112)
Creatinine, Ser: 0.74 mg/dL (ref 0.40–1.20)
GFR: 99.21 mL/min (ref >60.00)
Glucose, Bld: 79 mg/dL (ref 70–99)
Potassium: 4.3 mEq/L (ref 3.5–5.1)
Sodium: 138 mEq/L (ref 135–145)
Total Bilirubin: 1.1 mg/dL (ref 0.2–1.2)
Total Protein: 7.6 g/dL (ref 6.0–8.3)

## 2020-05-21 LAB — HEMOGLOBIN A1C: Hgb A1c MFr Bld: 6.3 % (ref 4.6–6.5)

## 2020-05-21 MED ORDER — FLUCONAZOLE 150 MG PO TABS: 150.0000 mg | ORAL_TABLET | Freq: Once | ORAL | 0 refills | Status: AC

## 2020-05-22 ENCOUNTER — Other Ambulatory Visit: Payer: Self-pay | Admitting: Internal Medicine

## 2020-05-22 DIAGNOSIS — R7302 Impaired glucose tolerance (oral): Secondary | ICD-10-CM

## 2020-06-01 ENCOUNTER — Encounter: Payer: Self-pay | Admitting: Internal Medicine

## 2020-06-01 ENCOUNTER — Telehealth: Payer: Self-pay | Admitting: Internal Medicine

## 2020-06-01 DIAGNOSIS — R000 Tachycardia, unspecified: Secondary | ICD-10-CM

## 2020-06-01 NOTE — Telephone Encounter (Signed)
Pt call and stated she want a referral to see dr.James Allred the Cardiologist at [facility] heart care.pt stated she want a call back.

## 2020-06-02 NOTE — Telephone Encounter (Signed)
A cardiology referral should have been placed at her last visit. Can you confirm?

## 2020-06-02 NOTE — Telephone Encounter (Signed)
Referral has been placed.  See Mychart note.

## 2020-06-02 NOTE — Telephone Encounter (Signed)
Okay to refer? 

## 2020-06-04 ENCOUNTER — Other Ambulatory Visit: Payer: Self-pay

## 2020-06-04 ENCOUNTER — Ambulatory Visit (INDEPENDENT_AMBULATORY_CARE_PROVIDER_SITE_OTHER): Payer: No Typology Code available for payment source | Admitting: Cardiology

## 2020-06-04 ENCOUNTER — Encounter: Payer: Self-pay | Admitting: Cardiology

## 2020-06-04 VITALS — BP 124/66 | HR 76 | Ht 70.0 in | Wt 185.6 lb

## 2020-06-04 DIAGNOSIS — I471 Supraventricular tachycardia: Secondary | ICD-10-CM | POA: Diagnosis not present

## 2020-06-04 MED ORDER — METOPROLOL SUCCINATE ER 25 MG PO TB24: 12.5000 mg | ORAL_TABLET | Freq: Every day | ORAL | 3 refills | Status: DC

## 2020-06-04 NOTE — Progress Notes (Signed)
Cardiology Consult Note    Date:  06/04/2020   ID:  Diane Kirk, DOB September 26, 1975, MRN [id number]  PCP:  Diane Kirk  Cardiologist:  Diane Magic, Kirk   Chief Complaint  Patient presents with  . New Patient (Initial Visit)    palpitations    History of Present Illness:  Diane Kirk is a 45 y.o. female who is being seen today for the evaluation of palpitations at the request of Diane Kirk, Diane Kirk*.  This is a 45yo female with a hx of anxiety, GERD and migraine HAs who was recently seen by her PCP for evaluation of palpitations.  She says that she has had palpitations sporadically since 2011 but since she had her baby in 2019 she noticed an increase in her palpitations which would take her breath away.  She says that a few times a month she will notice a fluttering in her chest associated with lightheadedness but no sycnope.  She says that if she lays down and props her feet up her symptoms would abate immediately.  She has an Apple watch and has recorded her HR > 200bpm at these times.  They occur randomly and can occur once monthly but sometimes up to 3 times in a month.  She recorded these and rhythm strips appear to be SVT. She denies any chest pain or pressure, LE edema or syncope.  Occasionally she will notice that she feels SOB and has to cough to feel like she can catch her breath but this is not when she has palpitations.  She drinks occasional caffeine but maybe 2-3 times monthly.   Past Medical History:  Diagnosis Date  . Anxiety   . Complication of anesthesia    epidural did not work  . Gastroesophageal reflux disease   . Gestational diabetes    glyburide  . Hemoglobin C trait (HCC)   . Migraine headache   . Psoriasis     Past Surgical History:  Procedure Laterality Date  . APPENDECTOMY    . BREAST CYST EXCISION    . ivf    . laproscopy    . REFRACTIVE SURGERY Bilateral   . sebaceous cyst resection     Scalp    Current  Medications: Current Meds  Medication Sig  . ibuprofen (ADVIL,MOTRIN) 600 MG tablet Take 1 tablet (600 mg total) by mouth every 6 (six) hours.    Allergies:   Azithromycin and Tape   Social History   Socioeconomic History  . Marital status: Married    Spouse name: Not on file  . Number of children: 2  . Years of education: Not on file  . Highest education level: Not on file  Occupational History  . Occupation: homemaker  Tobacco Use  . Smoking status: Never Smoker  . Smokeless tobacco: Never Used  Substance and Sexual Activity  . Alcohol use: No  . Drug use: No  . Sexual activity: Yes    Birth control/protection: Other-see comments    Comment: unsure at this time  Other Topics Concern  . Not on file  Social History Narrative   Work or School: stays at home      Home Situation: lives with husband and daughter 59 yo (kendall) in 2016      Spiritual Beliefs: Christian      Lifestyle: no regular exercise; diet is ok      Social Determinants of Corporate investment banker Strain: Not on file  Food Insecurity: Not on file  Transportation Needs: Not on file  Physical Activity: Not on file  Stress: Not on file  Social Connections: Not on file     Family History:  The patient's family history includes Breast cancer in her maternal grandmother; Colon cancer in her father; Diabetes in her paternal aunt; Heart disease in her maternal grandfather; Heart disease (age of onset: 34) in her paternal uncle; Hyperlipidemia in her father and mother; Hypertension in her father and mother; Kidney disease in her paternal aunt; Liver cancer in her father.   ROS:   Please see the history of present illness.    ROS All other systems reviewed and are negative.  No flowsheet data found.     PHYSICAL EXAM:   VS:  BP 124/66   Pulse 76   Ht 5\' 10"  (1.778 m)   Wt 185 lb 9.6 oz (84.2 kg)   BMI 26.63 kg/m    GEN: Well nourished, well developed, in no acute distress  HEENT: normal   Neck: no JVD, carotid bruits, or masses Cardiac: RRR; no murmurs, rubs, or gallops,no edema.  Intact distal pulses bilaterally.  Respiratory:  clear to auscultation bilaterally, normal work of breathing GI: soft, nontender, nondistended, + BS MS: no deformity or atrophy  Skin: warm and dry, no rash Neuro:  Alert and Oriented x 3, Strength and sensation are intact Psych: euthymic mood, full affect  Wt Readings from Last 3 Encounters:  06/04/20 185 lb 9.6 oz (84.2 kg)  05/20/20 187 lb (84.8 kg)  03/03/20 178 lb (80.7 kg)     Studies/Labs Reviewed:   EKG:  EKG is ordered today.  The ekg ordered today demonstrates NSR with normal intervals and no ST changes  Recent Labs: 05/20/2020: ALT 11; BUN 8; Creatinine, Ser 0.74; Hemoglobin 12.2; Platelets 214.0; Potassium 4.3; Sodium 138; TSH 1.01   Lipid Panel    Component Value Date/Time   CHOL 141 06/09/2014 0853   TRIG 56.0 06/09/2014 0853   HDL 49.30 06/09/2014 0853   CHOLHDL 3 06/09/2014 0853   VLDL 11.2 06/09/2014 0853   LDLCALC 81 06/09/2014 0853   Additional studies/ records that were reviewed today include:  OV notes from PCP    ASSESSMENT:    1. SVT (supraventricular tachycardia) (HCC)      PLAN:  In order of problems listed above:  1. Palpitations -these have been occurring for over 10 years but after the birth of her child they have become more frequent  -her apple watch caught this showing SVT at over 200bpm -baseline EKG shows NSR with normal intervals -recommend starting Toprol XL 12.5mg  daily for suppression -I will check a 2D echo to assess LVF and rule out structural heart disease -TSH was normal  Followup with me in 3 months  Medication Adjustments/Labs and Tests Ordered: Current medicines are reviewed at length with the patient today.  Concerns regarding medicines are outlined above.  Medication changes, Labs and Tests ordered today are listed in the Patient Instructions below.  There are no Patient  Instructions on file for this visit.   Signed, 06/11/2014, Kirk  06/04/2020 3:00 PM    [facility] [address] [place], [place], [place]  [place] Phone: [phone]; Fax: [phone]

## 2020-06-04 NOTE — Addendum Note (Signed)
Addended by: Theresia Majors on: 06/04/2020 03:13 PM   Modules accepted: Orders

## 2020-06-04 NOTE — Patient Instructions (Signed)
Medication Instructions:  Your physician has recommended you make the following change in your medication:  1) START taking Toprol XL (metoprolol succinate) 12.5 mg daily  *If you need a refill on your cardiac medications before your next appointment, please call your pharmacy*  Testing/Procedures: Your physician has requested that you have an echocardiogram. Echocardiography is a painless test that uses sound waves to create images of your heart. It provides your doctor with information about the size and shape of your heart and how well your heart's chambers and valves are working. This procedure takes approximately one hour. There are no restrictions for this procedure.  Follow-Up: At [facility], you and your health needs are our priority.  As part of our continuing mission to provide you with exceptional heart care, we have created designated Provider Care Teams.  These Care Teams include your primary Cardiologist (physician) and Advanced Practice Providers (APPs -  Physician Assistants and Nurse Practitioners) who all work together to provide you with the care you need, when you need it.  Your next appointment:   3 month(s)  The format for your next appointment:   In Person  Provider:   You may see Armanda Magic, MD or one of the following Advanced Practice Providers on your designated Care Team:    Ronie Spies, PA-C  Jacolyn Reedy, PA-C

## 2020-07-01 ENCOUNTER — Other Ambulatory Visit (HOSPITAL_COMMUNITY): Payer: No Typology Code available for payment source

## 2020-07-06 ENCOUNTER — Ambulatory Visit (HOSPITAL_COMMUNITY): Payer: No Typology Code available for payment source | Attending: Cardiology

## 2020-07-06 ENCOUNTER — Other Ambulatory Visit: Payer: Self-pay

## 2020-07-06 DIAGNOSIS — I471 Supraventricular tachycardia: Secondary | ICD-10-CM | POA: Insufficient documentation

## 2020-07-06 LAB — ECHOCARDIOGRAM COMPLETE
Area-P 1/2: 2.74 cm2
S' Lateral: 2.9 cm

## 2020-07-30 ENCOUNTER — Other Ambulatory Visit (HOSPITAL_COMMUNITY): Payer: No Typology Code available for payment source

## 2020-08-31 ENCOUNTER — Other Ambulatory Visit: Payer: Self-pay

## 2020-08-31 ENCOUNTER — Ambulatory Visit (INDEPENDENT_AMBULATORY_CARE_PROVIDER_SITE_OTHER): Payer: No Typology Code available for payment source | Admitting: Cardiology

## 2020-08-31 ENCOUNTER — Encounter: Payer: Self-pay | Admitting: Cardiology

## 2020-08-31 VITALS — BP 106/78 | HR 78 | Ht 70.0 in | Wt 177.6 lb

## 2020-08-31 DIAGNOSIS — I471 Supraventricular tachycardia: Secondary | ICD-10-CM

## 2020-08-31 MED ORDER — DILTIAZEM HCL ER COATED BEADS 120 MG PO CP24: 120.0000 mg | ORAL_CAPSULE | Freq: Every day | ORAL | 3 refills | Status: DC

## 2020-08-31 NOTE — Patient Instructions (Signed)
Medication Instructions:  Your physician has recommended you make the following change in your medication:  1) STOP taking Toprol XL (metoprolol succinate) 2) START taking Cardizem CD (diltiazem) 120 mg daily  *If you need a refill on your cardiac medications before your next appointment, please call your pharmacy*  Follow-Up: At [facility], you and your health needs are our priority.  As part of our continuing mission to provide you with exceptional heart care, we have created designated Provider Care Teams.  These Care Teams include your primary Cardiologist (physician) and Advanced Practice Providers (APPs -  Physician Assistants and Nurse Practitioners) who all work together to provide you with the care you need, when you need it.  Your next appointment:   1 year(s)  The format for your next appointment:   In Person  Provider:   You may see Armanda Magic, MD or one of the following Advanced Practice Providers on your designated Care Team:    Ronie Spies, PA-C  Jacolyn Reedy, PA-C

## 2020-08-31 NOTE — Addendum Note (Signed)
Addended by: Theresia Majors on: 08/31/2020 01:40 PM   Modules accepted: Orders

## 2020-08-31 NOTE — Progress Notes (Signed)
Cardiology Consult Note    Date:  08/31/2020   ID:  Diane Kirk, DOB 1975/11/20, MRN [id number]  PCP:  Philip Aspen, Limmie Patricia, MD  Cardiologist:  Armanda Magic, MD   Chief Complaint  Patient presents with  . Follow-up    SVT     History of Present Illness:  Diane Kirk is a 45 y.o. female with a hx of anxiety, GERD and migraine HAs who was recently seen by her PCP for evaluation of palpitations.  She has an Apple watch and recorded her HR > 200bpm. She recorded these and rhythm strips appeared to be SVT. She was started on Toprol XL 12.5mg  daily and 2D echo was normal.   SHe is here today for followup and is doing well.  She denies any chest pain or pressure, SOB, DOE, PND, orthopnea, LE edema, dizziness or syncope. She says that since starting on Toprol she has had some diarrhea and also makes her fatigued but has significantly decreased the episodes of palpitations.  Past Medical History:  Diagnosis Date  . Anxiety   . Complication of anesthesia    epidural did not work  . Gastroesophageal reflux disease   . Gestational diabetes    glyburide  . Hemoglobin C trait (HCC)   . Migraine headache   . Psoriasis     Past Surgical History:  Procedure Laterality Date  . APPENDECTOMY    . BREAST CYST EXCISION    . ivf    . laproscopy    . REFRACTIVE SURGERY Bilateral   . sebaceous cyst resection     Scalp    Current Medications: Current Meds  Medication Sig  . ibuprofen (ADVIL,MOTRIN) 600 MG tablet Take 1 tablet (600 mg total) by mouth every 6 (six) hours.  . methocarbamol (ROBAXIN) 500 MG tablet Take 1 tablet by mouth as needed for muscle spasms.  . SODIUM FLUORIDE, DENTAL RINSE, 0.2 % SOLN Take 1 Dose by mouth as needed.    Allergies:   Azithromycin and Tape   Social History   Socioeconomic History  . Marital status: Married    Spouse name: Not on file  . Number of children: 2  . Years of education: Not on file  . Highest education level: Not on file   Occupational History  . Occupation: homemaker  Tobacco Use  . Smoking status: Never Smoker  . Smokeless tobacco: Never Used  Substance and Sexual Activity  . Alcohol use: No  . Drug use: No  . Sexual activity: Yes    Birth control/protection: Other-see comments    Comment: unsure at this time  Other Topics Concern  . Not on file  Social History Narrative   Work or School: stays at home      Home Situation: lives with husband and daughter 9 yo (kendall) in 2016      Spiritual Beliefs: Christian      Lifestyle: no regular exercise; diet is ok      Social Determinants of Corporate investment banker Strain: Not on file  Food Insecurity: Not on file  Transportation Needs: Not on file  Physical Activity: Not on file  Stress: Not on file  Social Connections: Not on file     Family History:  The patient's family history includes Breast cancer in her maternal grandmother; Colon cancer in her father; Diabetes in her paternal aunt; Heart disease in her maternal grandfather; Heart disease (age of onset: 47) in her paternal uncle; Hyperlipidemia in her father and  mother; Hypertension in her father and mother; Kidney disease in her paternal aunt; Liver cancer in her father.   ROS:   Please see the history of present illness.    ROS All other systems reviewed and are negative.  No flowsheet data found.     PHYSICAL EXAM:   VS:  BP 106/78   Pulse 78   Ht 5\' 10"  (1.778 m)   Wt 177 lb 9.6 oz (80.6 kg)   SpO2 98%   BMI 25.48 kg/m    GEN: Well nourished, well developed in no acute distress HEENT: Normal NECK: No JVD; No carotid bruits LYMPHATICS: No lymphadenopathy CARDIAC:RRR, no murmurs, rubs, gallops RESPIRATORY:  Clear to auscultation without rales, wheezing or rhonchi  ABDOMEN: Soft, non-tender, non-distended MUSCULOSKELETAL:  No edema; No deformity  SKIN: Warm and dry NEUROLOGIC:  Alert and oriented x 3 PSYCHIATRIC:  Normal affect    Wt Readings from Last 3  Encounters:  08/31/20 177 lb 9.6 oz (80.6 kg)  06/04/20 185 lb 9.6 oz (84.2 kg)  05/20/20 187 lb (84.8 kg)     Studies/Labs Reviewed:   EKG:  EKG is not ordered today.    Recent Labs: 05/20/2020: ALT 11; BUN 8; Creatinine, Ser 0.74; Hemoglobin 12.2; Platelets 214.0; Potassium 4.3; Sodium 138; TSH 1.01   Lipid Panel    Component Value Date/Time   CHOL 141 06/09/2014 0853   TRIG 56.0 06/09/2014 0853   HDL 49.30 06/09/2014 0853   CHOLHDL 3 06/09/2014 0853   VLDL 11.2 06/09/2014 0853   LDLCALC 81 06/09/2014 0853   Additional studies/ records that were reviewed today include:  OV notes from PCP    ASSESSMENT:    1. SVT (supraventricular tachycardia) (HCC)      PLAN:  In order of problems listed above:  1.  SVT -these have been occurring for over 10 years but after the birth of her child they have become more frequent  -her apple watch caught this showing SVT at over 200bpm -baseline EKG showed NSR with normal intervals -she has had diarrhea with the Toprol and it makes her fatigued -2D echo showed normal LVF and normal Valves. -TSH was normal -I will stop Toprol and start Cardizem CD 120mg  daily -she will let me know if she has any side effects to the Cardizem or if it does not suppress her palptiations   Followup with me in 1 year  Medication Adjustments/Labs and Tests Ordered: Current medicines are reviewed at length with the patient today.  Concerns regarding medicines are outlined above.  Medication changes, Labs and Tests ordered today are listed in the Patient Instructions below.  There are no Patient Instructions on file for this visit.   Signed, 06/11/2014, MD  08/31/2020 1:33 PM    [facility] [address] [place], [place], [place]  [place] Phone: [phone]; Fax: [phone]

## 2020-09-04 ENCOUNTER — Ambulatory Visit: Payer: No Typology Code available for payment source | Admitting: Physician Assistant

## 2020-10-22 ENCOUNTER — Other Ambulatory Visit: Payer: Self-pay

## 2020-10-23 ENCOUNTER — Ambulatory Visit: Payer: No Typology Code available for payment source | Admitting: Internal Medicine

## 2020-10-23 ENCOUNTER — Encounter: Payer: Self-pay | Admitting: Neurology

## 2020-10-23 ENCOUNTER — Encounter: Payer: Self-pay | Admitting: Internal Medicine

## 2020-10-23 VITALS — BP 110/80 | HR 70 | Temp 98.4°F | Wt 175.3 lb

## 2020-10-23 DIAGNOSIS — R7302 Impaired glucose tolerance (oral): Secondary | ICD-10-CM | POA: Diagnosis not present

## 2020-10-23 DIAGNOSIS — I471 Supraventricular tachycardia: Secondary | ICD-10-CM | POA: Diagnosis not present

## 2020-10-23 DIAGNOSIS — R519 Headache, unspecified: Secondary | ICD-10-CM | POA: Diagnosis not present

## 2020-10-23 DIAGNOSIS — G8929 Other chronic pain: Secondary | ICD-10-CM

## 2020-10-23 LAB — POCT GLYCOSYLATED HEMOGLOBIN (HGB A1C): Hemoglobin A1C: 6 % — AB (ref 4.0–5.6)

## 2020-10-23 NOTE — Progress Notes (Signed)
Established Patient Office Visit     This visit occurred during the SARS-CoV-2 public health emergency.  Safety protocols were in place, including screening questions prior to the visit, additional usage of staff PPE, and extensive cleaning of exam room while observing appropriate contact time as indicated for disinfecting solutions.    CC/Reason for Visit: Follow-up chronic conditions and discuss headaches  HPI: Diane Kirk is a 45 y.o. female who is coming in today for the above mentioned reasons. Past Medical History is significant for: Impaired glucose tolerance and SVT.  She has had migraines in the past.  At last visit her A1c was 6.3.  She was started on metoprolol and scheduled follow-up with cardiology.  She was eventually transitioned over to Cardizem due to fatigue with beta-blocker.  Her SVT episodes have improved.  She wears an apple watch that keeps monitor of these.  She has been diagnosed with migraine headaches in the past.  This was around 2013.  Lately, since she stopped breast-feeding her daughter, headaches have increased in frequency.  She will have maybe 4 of these a month.  Definitely has photophobia and drinking caffeine helps.  Headache is over her right eye and travels the back of the right side of her scalp into the base of her skull.  She has had an eye exam recently that was normal.   Past Medical/Surgical History: Past Medical History:  Diagnosis Date   Anxiety    Complication of anesthesia    epidural did not work   Gastroesophageal reflux disease    Gestational diabetes    glyburide   Hemoglobin C trait (HCC)    Migraine headache    Psoriasis     Past Surgical History:  Procedure Laterality Date   APPENDECTOMY     BREAST CYST EXCISION     ivf     laproscopy     REFRACTIVE SURGERY Bilateral    sebaceous cyst resection     Scalp    Social History:  reports that she has never smoked. She has never used smokeless tobacco. She reports that  she does not drink alcohol and does not use drugs.  Allergies: Allergies  Allergen Reactions   Azithromycin Other (See Comments)    Vertigo    Tape Rash    Creates a bumpy rash    Family History:  Family History  Problem Relation Age of Onset   Hypertension Mother    Hyperlipidemia Mother    Hypertension Father    Hyperlipidemia Father    Liver cancer Father        mets to colon and pancreas   Colon cancer Father    Diabetes Paternal Aunt    Kidney disease Paternal Aunt    Heart disease Paternal Uncle 18   Breast cancer Maternal Grandmother    Heart disease Maternal Grandfather    Esophageal cancer Neg Hx    Stomach cancer Neg Hx    Rectal cancer Neg Hx      Current Outpatient Medications:    diltiazem (CARDIZEM CD) 120 MG 24 hr capsule, Take 1 capsule (120 mg total) by mouth daily., Disp: 90 capsule, Rfl: 3   ibuprofen (ADVIL,MOTRIN) 600 MG tablet, Take 1 tablet (600 mg total) by mouth every 6 (six) hours., Disp: 30 tablet, Rfl: 0   methocarbamol (ROBAXIN) 500 MG tablet, Take 1 tablet by mouth as needed for muscle spasms., Disp: , Rfl:    SODIUM FLUORIDE, DENTAL RINSE, 0.2 % SOLN, Take 1  Dose by mouth as needed., Disp: , Rfl:   Review of Systems:  Constitutional: Denies fever, chills, diaphoresis, appetite change and fatigue.  HEENT: Denies photophobia, eye pain, redness, hearing loss, ear pain, congestion, sore throat, rhinorrhea, sneezing, mouth sores, trouble swallowing, neck pain, neck stiffness and tinnitus.   Respiratory: Denies SOB, DOE, cough, chest tightness,  and wheezing.   Cardiovascular: Denies chest pain and leg swelling.  Gastrointestinal: Denies nausea, vomiting, abdominal pain, diarrhea, constipation, blood in stool and abdominal distention.  Genitourinary: Denies dysuria, urgency, frequency, hematuria, flank pain and difficulty urinating.  Endocrine: Denies: hot or cold intolerance, sweats, changes in hair or nails, polyuria,  polydipsia. Musculoskeletal: Denies myalgias, back pain, joint swelling, arthralgias and gait problem.  Skin: Denies pallor, rash and wound.  Neurological: Denies dizziness, seizures, syncope, weakness, light-headedness, numbness. Hematological: Denies adenopathy. Easy bruising, personal or family bleeding history  Psychiatric/Behavioral: Denies suicidal ideation, mood changes, confusion, nervousness, sleep disturbance and agitation    Physical Exam: Vitals:   10/23/20 0814  BP: 110/80  Pulse: 70  Temp: 98.4 F (36.9 C)  TempSrc: Oral  SpO2: 97%  Weight: 175 lb 4.8 oz (79.5 kg)    Body mass index is 25.15 kg/m.   Constitutional: NAD, calm, comfortable Eyes: PERRL, lids and conjunctivae normal ENMT: Mucous membranes are moist.  Respiratory: clear to auscultation bilaterally, no wheezing, no crackles. Normal respiratory effort. No accessory muscle use.  Cardiovascular: Regular rate and rhythm, no murmurs / rubs / gallops. No extremity edema.  Neurologic: Grossly intact and nonfocal Psychiatric: Normal judgment and insight. Alert and oriented x 3. Normal mood.    Impression and Plan:  IGT (impaired glucose tolerance)  -In office A1c today is 6.0, still in the prediabetic range.  She will continue lifestyle changes and return in 6 months for follow-up.  Chronic intractable headache, unspecified headache type  - Plan: Ambulatory referral to Neurology -While migraine remains a possibility, I also wonder about cluster headaches.  SVT (supraventricular tachycardia) (HCC) -Episodes have decreased in frequency on calcium channel blocker.  Time spent: 31 minutes reviewing chart, interviewing and examining patient and formulating plan of care.     Chaya Jan, MD [facility] at [facility]

## 2020-11-02 ENCOUNTER — Emergency Department (HOSPITAL_COMMUNITY)
Admission: EM | Admit: 2020-11-02 | Discharge: 2020-11-02 | Disposition: A | Discharge status: Home / Self Care | Payer: No Typology Code available for payment source | Attending: Emergency Medicine | Admitting: Emergency Medicine

## 2020-11-02 ENCOUNTER — Encounter (HOSPITAL_COMMUNITY): Payer: Self-pay | Admitting: *Deleted

## 2020-11-02 ENCOUNTER — Other Ambulatory Visit: Payer: Self-pay

## 2020-11-02 DIAGNOSIS — Z5321 Procedure and treatment not carried out due to patient leaving prior to being seen by health care provider: Secondary | ICD-10-CM | POA: Diagnosis not present

## 2020-11-02 DIAGNOSIS — R200 Anesthesia of skin: Secondary | ICD-10-CM | POA: Insufficient documentation

## 2020-11-02 DIAGNOSIS — R519 Headache, unspecified: Secondary | ICD-10-CM | POA: Diagnosis not present

## 2020-11-02 DIAGNOSIS — M542 Cervicalgia: Secondary | ICD-10-CM | POA: Diagnosis not present

## 2020-11-02 NOTE — ED Notes (Signed)
Pt ambulatory in ED lobby. 

## 2020-11-02 NOTE — ED Provider Notes (Cosign Needed Addendum)
Emergency Medicine Provider Triage Evaluation Note  Diane Kirk , a 45 y.o. female  was evaluated in triage.  Pt complains of headaches, describes these have been ongoing, but more frequently in the last couple months.  However, today feels that the pain is radiating to her neck, did take some promethazine to help with pain control without much improvement.  Also endorses photophobia.  Review of Systems  Positive: Headache, photophobia Negative: Neck pain, vomiting  Physical Exam  BP 118/80 (BP Location: Left Arm)   Pulse 60   Temp 98.3 F (36.8 C) (Oral)   Resp 18   Ht 5\' 10"  (1.778 m)   Wt 77.1 kg   LMP 10/27/2020   SpO2 100%   BMI 24.39 kg/m  Gen:   Awake, no distress   Resp:  Normal effort  MSK:   Moves extremities without difficulty  Other:  Neurologically intact, no decrease sensation, pupils are equal, symmetric and reactive.  Sensation is intact throughout.  Medical Decision Making  Medically screening exam initiated at 8:30 PM.  Appropriate orders placed.  12/27/2020 was informed that the remainder of the evaluation will be completed by another provider, this initial triage assessment does not replace that evaluation, and the importance of remaining in the ED until their evaluation is complete.   Patient with a past medical history of migraines presents today with headache along the right side of her eye with radiation to the back and down her neck, similar in presentation to her previous headaches.  According to extensive chart review, evaluated by PCP in the month of July, from medical records obtained  "This was around 2013.  Lately, since she stopped breast-feeding her daughter, headaches have increased in frequency.  She will have maybe 4 of these a month."   2014, PA-C 11/02/20 2031    2032, PA-C 11/02/20 2036

## 2020-11-02 NOTE — ED Notes (Signed)
Pt turned in pt stickers and left the facility.

## 2020-11-02 NOTE — ED Triage Notes (Signed)
Pt with history of migraines, coming in with head pain that starts behind her right eye and goes over her head and down her neck; only on the right. Also she feels a numbness to the top of her left foot and a "vibration" sensation to the left leg that is intermittent. Pt took her home compazine with no relief.

## 2020-11-03 ENCOUNTER — Encounter: Payer: Self-pay | Admitting: Internal Medicine

## 2020-11-04 ENCOUNTER — Other Ambulatory Visit: Payer: Self-pay | Admitting: Internal Medicine

## 2020-11-04 DIAGNOSIS — G43709 Chronic migraine without aura, not intractable, without status migrainosus: Secondary | ICD-10-CM

## 2020-11-04 MED ORDER — SUMATRIPTAN SUCCINATE 50 MG PO TABS
50.0000 mg | ORAL_TABLET | ORAL | 0 refills | Status: DC | PRN
Start: 2020-11-04 — End: 2021-10-25

## 2021-01-12 NOTE — Progress Notes (Signed)
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 Keep headache diary Obtain MRI results from her orthopedist. Follow up 6 months.    Subjective:  Diane Kirk is a 45 year old female with psoriasis, SVT and anxiety who presents for headaches.  History supplemented by referring provider's note.  Onset:  Mild headaches since her early 55s.  Worse since 2019 after birth of her daughter Location:  start behind right eye and radiate to back of head at base of skull. Quality:  throbbing behind right eye, burning/shooting radiating to back of head Intensity:  5/10 Aura:  absent Prodrome:  absent Associated symptoms:  Sometimes left ptosis, photophobia and nausea.  She denies associated vomiting, phonophobia, visual disturbance, conjunctival injection, eye lacrimation, nasal congestion/rhinorrhea or unilateral numbness or weakness. Duration:  3 days, progresses from mild to severe.  With 38s, within an hour Frequency:  Variable.  May happen twice a week or may skip a week (total 8 to 10 days a month) Triggers:  Unknown Relieving factors:  no Activity:  aggravates She does have associated neck pain. She saw orthopedics who told her that she had a pinched nerve in her neck that may respond to [facility].  She declined.  Current NSAIDS/analgesics:  ibuprofen Current triptans:  none Current ergotamine:  none Current anti-emetic:  none Current muscle relaxants:  none Current Antihypertensive medications:   diltiazem Current Antidepressant medications:  none Current Anticonvulsant medications:  none Current anti-CGRP:  Ubrelvy 100mg  Current Vitamins/Herbal/Supplements:  none Current Antihistamines/Decongestants:  none Other therapy:  none Hormone/birth control:  none  Past NSAIDS/analgesics:  ibuprofen, Tylenol Past abortive triptans:  rizatriptan, Zomig ZMT, sumatriptan 50mg  Past abortive ergotamine:  none Past muscle relaxants:  Robaxin Past anti-emetic:  none Past antihypertensive medications:  metoprolol Past antidepressant medications:  none Past anticonvulsant medications:  topiramate Past anti-CGRP:  none Past vitamins/Herbal/Supplements:  none Past antihistamines/decongestants:  meclizine Other past therapies:  physical therapy for cervical pain   PAST MEDICAL HISTORY: Past Medical History:  Diagnosis Date   Anxiety    Complication of anesthesia    epidural did not work   Gastroesophageal reflux disease    Gestational diabetes    glyburide   Hemoglobin C trait (HCC)    Migraine headache    Psoriasis     PAST SURGICAL HISTORY: Past Surgical History:  Procedure Laterality Date   APPENDECTOMY     BREAST CYST EXCISION     ivf     laproscopy     REFRACTIVE SURGERY Bilateral    sebaceous cyst resection     Scalp    MEDICATIONS: Current Outpatient Medications on File Prior to Visit  Medication Sig Dispense Refill   diltiazem (CARDIZEM CD) 120 MG 24 hr capsule Take 1 capsule (120 mg total) by mouth daily. 90 capsule 3   ibuprofen (ADVIL,MOTRIN) 600 MG tablet Take 1 tablet (600 mg total) by mouth every 6 (six) hours. 30 tablet 0   methocarbamol (ROBAXIN) 500 MG tablet Take 1 tablet by mouth as needed for muscle spasms.     SODIUM FLUORIDE, DENTAL RINSE,  0.2 % SOLN Take 1 Dose by mouth as needed.     SUMAtriptan (IMITREX) 50 MG tablet Take 1 tablet (50 mg total) by mouth every 2 (two) hours as needed for migraine. May repeat in 2 hours if headache persists or  recurs. 10 tablet 0   No current facility-administered medications on file prior to visit.    ALLERGIES: Allergies  Allergen Reactions   Azithromycin Other (See Comments)    Vertigo    Tape Rash    Creates a bumpy rash    FAMILY HISTORY: Family History  Problem Relation Age of Onset   Hypertension Mother    Hyperlipidemia Mother    Hypertension Father    Hyperlipidemia Father    Liver cancer Father        mets to colon and pancreas   Colon cancer Father    Diabetes Paternal Aunt    Kidney disease Paternal Aunt    Heart disease Paternal Uncle 26   Breast cancer Maternal Grandmother    Heart disease Maternal Grandfather    Esophageal cancer Neg Hx    Stomach cancer Neg Hx    Rectal cancer Neg Hx     Objective:  Blood pressure 107/64, pulse 77, height 5\' 10"  (1.778 m), weight 155 lb (70.3 kg), SpO2 100 %, currently breastfeeding. General: No acute distress.  Patient appears well-groomed.   Head:  Normocephalic/atraumatic, no tenderness at the occipital notch Eyes:  fundi examined but not visualized Neck: supple, right paraspinal tenderness, full range of motion Back: No paraspinal tenderness Heart: regular rate and rhythm Lungs: Clear to auscultation bilaterally. Vascular: No carotid bruits. Neurological Exam: Mental status: alert and oriented to person, place, and time, recent and remote memory intact, fund of knowledge intact, attention and concentration intact, speech fluent and not dysarthric, language intact. Cranial nerves: CN I: not tested CN II: pupils equal, round and reactive to light, visual fields intact CN III, IV, VI:  full range of motion, no nystagmus, no ptosis CN V: facial sensation intact. CN VII: upper and lower face symmetric CN VIII: hearing intact CN IX, X: gag intact, uvula midline CN XI: sternocleidomastoid and trapezius muscles intact CN XII: tongue midline Bulk & Tone: normal, no fasciculations. Motor:  muscle strength 5/5  throughout Sensation:  Pinprick, temperature and vibratory sensation intact. Deep Tendon Reflexes:  2+ throughout,  toes downgoing.   Finger to nose testing:  Without dysmetria.   Heel to shin:  Without dysmetria.   Gait:  Normal station and stride.  Romberg negative.    Thank you for allowing me to take part in the care of this patient.  , DO  CC: Shon Millet, MD

## 2021-01-13 ENCOUNTER — Encounter: Payer: Self-pay | Admitting: Neurology

## 2021-01-13 ENCOUNTER — Other Ambulatory Visit: Payer: Self-pay

## 2021-01-13 ENCOUNTER — Ambulatory Visit: Payer: No Typology Code available for payment source | Admitting: Neurology

## 2021-01-13 VITALS — BP 107/64 | HR 77 | Ht 70.0 in | Wt 155.0 lb

## 2021-01-13 DIAGNOSIS — G43809 Other migraine, not intractable, without status migrainosus: Secondary | ICD-10-CM

## 2021-01-13 MED ORDER — GABAPENTIN 100 MG PO CAPS: ORAL_CAPSULE | ORAL | 0 refills | Status: DC

## 2021-01-13 NOTE — Patient Instructions (Signed)
Start gabapentin as directed.  If no improvement in 5 weeks, contact me and we can increase dose. Use Ubrelvy as directed. Follow up 6 months.

## 2021-04-23 ENCOUNTER — Encounter: Payer: No Typology Code available for payment source | Admitting: Internal Medicine

## 2021-04-27 ENCOUNTER — Encounter: Payer: Self-pay | Admitting: Internal Medicine

## 2021-04-27 ENCOUNTER — Ambulatory Visit (INDEPENDENT_AMBULATORY_CARE_PROVIDER_SITE_OTHER): Payer: No Typology Code available for payment source | Admitting: Internal Medicine

## 2021-04-27 VITALS — BP 100/68 | HR 84 | Temp 98.4°F | Ht 69.5 in | Wt 156.3 lb

## 2021-04-27 DIAGNOSIS — E559 Vitamin D deficiency, unspecified: Secondary | ICD-10-CM

## 2021-04-27 DIAGNOSIS — G43809 Other migraine, not intractable, without status migrainosus: Secondary | ICD-10-CM | POA: Diagnosis not present

## 2021-04-27 DIAGNOSIS — Z0000 Encounter for general adult medical examination without abnormal findings: Secondary | ICD-10-CM | POA: Diagnosis not present

## 2021-04-27 DIAGNOSIS — R7302 Impaired glucose tolerance (oral): Secondary | ICD-10-CM | POA: Diagnosis not present

## 2021-04-27 LAB — COMPREHENSIVE METABOLIC PANEL
ALT: 10 U/L (ref 0–35)
AST: 13 U/L (ref 0–37)
Albumin: 4.4 g/dL (ref 3.5–5.2)
Alkaline Phosphatase: 44 U/L (ref 39–117)
BUN: 10 mg/dL (ref 6–23)
CO2: 28 mEq/L (ref 19–32)
Calcium: 9.5 mg/dL (ref 8.4–10.5)
Chloride: 105 mEq/L (ref 96–112)
Creatinine, Ser: 0.81 mg/dL (ref 0.40–1.20)
GFR: 88.43 mL/min (ref >60.00)
Glucose, Bld: 98 mg/dL (ref 70–99)
Potassium: 4.3 mEq/L (ref 3.5–5.1)
Sodium: 137 mEq/L (ref 135–145)
Total Bilirubin: 1.3 mg/dL — ABNORMAL HIGH (ref 0.2–1.2)
Total Protein: 7.6 g/dL (ref 6.0–8.3)

## 2021-04-27 LAB — LIPID PANEL
Cholesterol: 150 mg/dL (ref 0–200)
HDL: 60.4 mg/dL (ref >39.00)
LDL Cholesterol: 79 mg/dL (ref 0–99)
NonHDL: 89.44
Total CHOL/HDL Ratio: 2
Triglycerides: 51 mg/dL (ref 0.0–149.0)
VLDL: 10.2 mg/dL (ref 0.0–40.0)

## 2021-04-27 LAB — CBC WITH DIFFERENTIAL/PLATELET
Basophils Absolute: 0 10*3/uL (ref 0.0–0.1)
Basophils Relative: 0.5 % (ref 0.0–3.0)
Eosinophils Absolute: 0 10*3/uL (ref 0.0–0.7)
Eosinophils Relative: 0.3 % (ref 0.0–5.0)
HCT: 39 % (ref 36.0–46.0)
Hemoglobin: 12.4 g/dL (ref 12.0–15.0)
Lymphocytes Relative: 29.2 % (ref 12.0–46.0)
Lymphs Abs: 2 10*3/uL (ref 0.7–4.0)
MCHC: 31.8 g/dL (ref 30.0–36.0)
MCV: 73.3 fl — ABNORMAL LOW (ref 78.0–100.0)
Monocytes Absolute: 0.3 10*3/uL (ref 0.1–1.0)
Monocytes Relative: 4.6 % (ref 3.0–12.0)
Neutro Abs: 4.4 10*3/uL (ref 1.4–7.7)
Neutrophils Relative %: 65.4 % (ref 43.0–77.0)
Platelets: 206 10*3/uL (ref 150.0–400.0)
RBC: 5.33 Mil/uL — ABNORMAL HIGH (ref 3.87–5.11)
RDW: 14.6 % (ref 11.5–15.5)
WBC: 6.8 10*3/uL (ref 4.0–10.5)

## 2021-04-27 LAB — POCT GLYCOSYLATED HEMOGLOBIN (HGB A1C): Hemoglobin A1C: 6.1 % — AB (ref 4.0–5.6)

## 2021-04-27 LAB — HEMOGLOBIN A1C: Hgb A1c MFr Bld: 6 % (ref 4.6–6.5)

## 2021-04-27 LAB — VITAMIN D 25 HYDROXY (VIT D DEFICIENCY, FRACTURES): VITD: 26.07 ng/mL — ABNORMAL LOW (ref 30.00–100.00)

## 2021-04-27 NOTE — Progress Notes (Signed)
Established Patient Office Visit     This visit occurred during the SARS-CoV-2 public health emergency.  Safety protocols were in place, including screening questions prior to the visit, additional usage of staff PPE, and extensive cleaning of exam room while observing appropriate contact time as indicated for disinfecting solutions.    CC/Reason for Visit: Annual preventive exam  HPI: Diane Kirk is a 46 y.o. female who is coming in today for the above mentioned reasons. Past Medical History is significant for: History of SVT, impaired glucose tolerance, vitamin D deficiency, migraine headaches followed by neurology.  She is doing well and has no acute concerns or complaints today.  She has routine eye and dental care.  She exercises by walking on a daily basis.  She is overdue for flu vaccine and COVID booster.  She had a mammogram and Pap smear with her GYN in 2022.   Past Medical/Surgical History: Past Medical History:  Diagnosis Date   Anxiety    Complication of anesthesia    epidural did not work   Gastroesophageal reflux disease    Gestational diabetes    glyburide   Hemoglobin C trait (HCC)    Migraine headache    Psoriasis     Past Surgical History:  Procedure Laterality Date   APPENDECTOMY     BREAST CYST EXCISION     ivf     laproscopy     REFRACTIVE SURGERY Bilateral    sebaceous cyst resection     Scalp    Social History:  reports that she has never smoked. She has never used smokeless tobacco. She reports that she does not drink alcohol and does not use drugs.  Allergies: Allergies  Allergen Reactions   Azithromycin Other (See Comments)    Vertigo    Tape Rash    Creates a bumpy rash    Family History:  Family History  Problem Relation Age of Onset   Hypertension Mother    Hyperlipidemia Mother    Hypertension Father    Hyperlipidemia Father    Liver cancer Father        mets to colon and pancreas   Colon cancer Father    Stroke  Maternal Uncle    Diabetes Paternal Aunt    Kidney disease Paternal Aunt    Heart disease Paternal Uncle 35   Parkinsonism Maternal Grandmother    Dementia Maternal Grandmother    Breast cancer Maternal Grandmother    Heart disease Maternal Grandfather    Esophageal cancer Neg Hx    Stomach cancer Neg Hx    Rectal cancer Neg Hx      Current Outpatient Medications:    diltiazem (CARDIZEM CD) 120 MG 24 hr capsule, Take 1 capsule (120 mg total) by mouth daily., Disp: 90 capsule, Rfl: 3   ibuprofen (ADVIL,MOTRIN) 600 MG tablet, Take 1 tablet (600 mg total) by mouth every 6 (six) hours., Disp: 30 tablet, Rfl: 0   SODIUM FLUORIDE, DENTAL RINSE, 0.2 % SOLN, Take 1 Dose by mouth as needed., Disp: , Rfl:    SUMAtriptan (IMITREX) 50 MG tablet, Take 1 tablet (50 mg total) by mouth every 2 (two) hours as needed for migraine. May repeat in 2 hours if headache persists or recurs., Disp: 10 tablet, Rfl: 0   Ubrogepant (UBRELVY) 100 MG TABS, Take by mouth., Disp: , Rfl:    gabapentin (NEURONTIN) 100 MG capsule, Take 1 capsule at bedtime for one week, then 1 capsule twice daily., Disp: 60  capsule, Rfl: 0   methocarbamol (ROBAXIN) 500 MG tablet, Take 1 tablet by mouth as needed for muscle spasms. (Patient not taking: Reported on 01/13/2021), Disp: , Rfl:   Review of Systems:  Constitutional: Denies fever, chills, diaphoresis, appetite change and fatigue.  HEENT: Denies photophobia, eye pain, redness, hearing loss, ear pain, congestion, sore throat, rhinorrhea, sneezing, mouth sores, trouble swallowing, neck pain, neck stiffness and tinnitus.   Respiratory: Denies SOB, DOE, cough, chest tightness,  and wheezing.   Cardiovascular: Denies chest pain, palpitations and leg swelling.  Gastrointestinal: Denies nausea, vomiting, abdominal pain, diarrhea, constipation, blood in stool and abdominal distention.  Genitourinary: Denies dysuria, urgency, frequency, hematuria, flank pain and difficulty urinating.   Endocrine: Denies: hot or cold intolerance, sweats, changes in hair or nails, polyuria, polydipsia. Musculoskeletal: Denies myalgias, back pain, joint swelling, arthralgias and gait problem.  Skin: Denies pallor, rash and wound.  Neurological: Denies dizziness, seizures, syncope, weakness, light-headedness, numbness and headaches.  Hematological: Denies adenopathy. Easy bruising, personal or family bleeding history  Psychiatric/Behavioral: Denies suicidal ideation, mood changes, confusion, nervousness, sleep disturbance and agitation    Physical Exam: Vitals:   04/27/21 1037  BP: 100/68  Pulse: 84  Temp: 98.4 F (36.9 C)  TempSrc: Oral  SpO2: 98%  Weight: 156 lb 4.8 oz (70.9 kg)  Height: 5' 9.5" (1.765 m)    Body mass index is 22.75 kg/m.   Constitutional: NAD, calm, comfortable Eyes: PERRL, lids and conjunctivae normal ENMT: Mucous membranes are moist. Posterior pharynx clear of any exudate or lesions. Normal dentition. Tympanic membrane is pearly white, no erythema or bulging. Neck: normal, supple, no masses, no thyromegaly Respiratory: clear to auscultation bilaterally, no wheezing, no crackles. Normal respiratory effort. No accessory muscle use.  Cardiovascular: Regular rate and rhythm, no murmurs / rubs / gallops. No extremity edema. 2+ pedal pulses. No carotid bruits.  Abdomen: no tenderness, no masses palpated. No hepatosplenomegaly. Bowel sounds positive.  Musculoskeletal: no clubbing / cyanosis. No joint deformity upper and lower extremities. Good ROM, no contractures. Normal muscle tone.  Skin: no rashes, lesions, ulcers. No induration Neurologic: CN 2-12 grossly intact. Sensation intact, DTR normal. Strength 5/5 in all 4.  Psychiatric: Normal judgment and insight. Alert and oriented x 3. Normal mood.    Impression and Plan:  Encounter for preventive health examination -Recommend routine eye and dental care. -Immunizations: She is due for flu and COVID booster,  she declines for today, will get COVID booster at pharmacy.  Tdap is up-to-date. -Healthy lifestyle discussed in detail. -Labs to be updated today. -Colon cancer screening: 02/2020 -Breast cancer screening: 06/2020 -Cervical cancer screening: 02/2021 -Lung cancer screening: Not applicable -Prostate cancer screening: Not applicable -DEXA: Not applicable  IGT (impaired glucose tolerance)  - Plan: POCT HgB A1C, CBC with Differential/Platelet, Comprehensive metabolic panel, Lipid panel -In office A1c today is 6.1.  Vitamin D deficiency  - Plan: VITAMIN D 25 Hydroxy (Vit-D Deficiency, Fractures)  Other migraine without status migrainosus, not intractable -She is now on gabapentin preventative and Ubrelvy as an abortive.  She is followed by Dr. Tomi Likens.    Patient Instructions  -Nice seeing you today!!  -Lab work today; will notify you once results are available.  -Remember your flu and COVID boosters.  -Schedule follow up in 6 months.      Lelon Frohlich, MD [facility] at [facility]

## 2021-04-27 NOTE — Patient Instructions (Signed)
-  Nice seeing you today!!  -Lab work today; will notify you once results are available.  -Remember your flu and COVID boosters.  -Schedule follow up in 6 months.

## 2021-04-28 ENCOUNTER — Other Ambulatory Visit (INDEPENDENT_AMBULATORY_CARE_PROVIDER_SITE_OTHER): Payer: No Typology Code available for payment source

## 2021-04-28 ENCOUNTER — Other Ambulatory Visit: Payer: Self-pay | Admitting: Internal Medicine

## 2021-04-28 DIAGNOSIS — E559 Vitamin D deficiency, unspecified: Secondary | ICD-10-CM

## 2021-04-28 DIAGNOSIS — R7989 Other specified abnormal findings of blood chemistry: Secondary | ICD-10-CM | POA: Diagnosis not present

## 2021-04-28 LAB — TSH: TSH: 0.81 u[IU]/mL (ref 0.35–5.50)

## 2021-04-28 MED ORDER — VITAMIN D (ERGOCALCIFEROL) 1.25 MG (50000 UNIT) PO CAPS: 50000.0000 [IU] | ORAL_CAPSULE | ORAL | 0 refills | Status: AC

## 2021-04-28 NOTE — Addendum Note (Signed)
Addended by: Johnella Moloney on: 04/28/2021 04:16 PM   Modules accepted: Orders

## 2021-07-29 ENCOUNTER — Other Ambulatory Visit: Payer: Self-pay | Admitting: Internal Medicine

## 2021-07-29 ENCOUNTER — Other Ambulatory Visit (INDEPENDENT_AMBULATORY_CARE_PROVIDER_SITE_OTHER): Payer: No Typology Code available for payment source

## 2021-07-29 ENCOUNTER — Other Ambulatory Visit: Payer: Self-pay | Admitting: *Deleted

## 2021-07-29 DIAGNOSIS — E559 Vitamin D deficiency, unspecified: Secondary | ICD-10-CM

## 2021-07-29 LAB — VITAMIN D 25 HYDROXY (VIT D DEFICIENCY, FRACTURES): VITD: 30.6 ng/mL (ref 30.00–100.00)

## 2021-07-29 MED ORDER — VITAMIN D (ERGOCALCIFEROL) 1.25 MG (50000 UNIT) PO CAPS: 50000.0000 [IU] | ORAL_CAPSULE | ORAL | 0 refills | Status: AC

## 2021-08-02 ENCOUNTER — Ambulatory Visit: Payer: No Typology Code available for payment source | Admitting: Neurology

## 2021-09-30 ENCOUNTER — Encounter (HOSPITAL_COMMUNITY): Payer: Self-pay

## 2021-09-30 ENCOUNTER — Emergency Department (HOSPITAL_COMMUNITY)
Admission: EM | Admit: 2021-09-30 | Discharge: 2021-10-01 | Disposition: A | Discharge status: Home / Self Care | Payer: No Typology Code available for payment source | Attending: Emergency Medicine | Admitting: Emergency Medicine

## 2021-09-30 ENCOUNTER — Emergency Department (HOSPITAL_COMMUNITY): Payer: No Typology Code available for payment source

## 2021-09-30 ENCOUNTER — Other Ambulatory Visit: Payer: Self-pay

## 2021-09-30 DIAGNOSIS — R519 Headache, unspecified: Secondary | ICD-10-CM | POA: Diagnosis present

## 2021-09-30 DIAGNOSIS — G43909 Migraine, unspecified, not intractable, without status migrainosus: Secondary | ICD-10-CM | POA: Diagnosis not present

## 2021-09-30 DIAGNOSIS — H538 Other visual disturbances: Secondary | ICD-10-CM | POA: Insufficient documentation

## 2021-09-30 DIAGNOSIS — G43809 Other migraine, not intractable, without status migrainosus: Secondary | ICD-10-CM

## 2021-09-30 NOTE — ED Triage Notes (Signed)
Pt presents to ED from home with c/o migraine that began around 2000, pt states less than hour later her vision became blurry and has persisted. Hx migraines, last took sumatriptan 50 mg at 2052. Reports nausea, no vomiting.

## 2021-10-01 LAB — I-STAT BETA HCG BLOOD, ED (MC, WL, AP ONLY): I-stat hCG, quantitative: <5 m[IU]/mL (ref <5)

## 2021-10-01 MED ORDER — KETOROLAC TROMETHAMINE 30 MG/ML IJ SOLN
30.0000 mg | Freq: Once | INTRAMUSCULAR | Status: AC
  Administered 2021-10-01: 30 mg via INTRAVENOUS
  Filled 2021-10-01: qty 1

## 2021-10-01 MED ORDER — MAGNESIUM SULFATE 2 GM/50ML IV SOLN
2.0000 g | Freq: Once | INTRAVENOUS | Status: AC
Start: 2021-10-01 — End: 2021-10-01
  Administered 2021-10-01: 2 g via INTRAVENOUS
  Filled 2021-10-01: qty 50

## 2021-10-01 MED ORDER — SODIUM CHLORIDE 0.9 % IV BOLUS
500.0000 mL | Freq: Once | INTRAVENOUS | Status: AC
  Administered 2021-10-01: 500 mL via INTRAVENOUS

## 2021-10-01 NOTE — ED Provider Notes (Addendum)
[facility]-EMERGENCY DEPT Provider Note   CSN: [id number] Arrival date & time: 09/30/21  2327     History  Chief Complaint  Patient presents with   Migraine   Blurred Vision    Diane Kirk is a 46 y.o. female.  The history is provided by the patient.  Migraine This is a recurrent problem. The current episode started 3 to 5 hours ago. The problem occurs constantly. The problem has not changed since onset.Associated symptoms include headaches. Pertinent negatives include no chest pain, no abdominal pain and no shortness of breath. Nothing aggravates the symptoms. Nothing relieves the symptoms. She has tried nothing for the symptoms. The treatment provided no relief.       Home Medications Prior to Admission medications   Medication Sig Start Date End Date Taking? Authorizing Provider  SUMAtriptan (IMITREX) 50 MG tablet Take 1 tablet (50 mg total) by mouth every 2 (two) hours as needed for migraine. May repeat in 2 hours if headache persists or recurs. 11/04/20  Yes Philip Aspen, Limmie Patricia, MD  Ubrogepant (UBRELVY) 100 MG TABS Take 1 tablet by mouth daily as needed (migraines).   Yes [provider]  Vitamin D, Ergocalciferol, (DRISDOL) 1.25 MG (50000 UNIT) CAPS capsule Take 1 capsule (50,000 Units total) by mouth every 7 (seven) days for 12 doses. 07/29/21 10/15/21 Yes Henderson Cloud, MD  benzonatate (TESSALON) 100 MG capsule Take 100 mg by mouth 3 (three) times daily as needed. Patient not taking: Reported on 10/01/2021 08/24/21   [provider]  diltiazem (CARDIZEM CD) 120 MG 24 hr capsule Take 1 capsule (120 mg total) by mouth daily. Patient not taking: Reported on 10/01/2021 08/31/20   Quintella Reichert, MD  gabapentin (NEURONTIN) 100 MG capsule Take 1 capsule at bedtime for one week, then 1 capsule twice daily. 01/13/21   Drema Dallas, DO  ibuprofen (ADVIL,MOTRIN) 600 MG tablet Take 1 tablet (600 mg total) by mouth every 6 (six)  hours. Patient not taking: Reported on 10/01/2021 10/07/17   Clemmons, Elmore Guise, CNM  methocarbamol (ROBAXIN) 500 MG tablet Take 1 tablet by mouth as needed for muscle spasms. Patient not taking: Reported on 01/13/2021 06/09/20   [provider]      Allergies    Azithromycin and Tape    Review of Systems   Review of Systems  Constitutional:  Negative for fever.  HENT:  Negative for facial swelling.   Eyes:  Negative for redness.  Respiratory:  Negative for shortness of breath.   Cardiovascular:  Negative for chest pain.  Gastrointestinal:  Negative for abdominal pain.  Neurological:  Positive for headaches.  All other systems reviewed and are negative.   Physical Exam Updated Vital Signs BP 121/78   Pulse 64   Temp 97.7 F (36.5 C) (Oral)   Resp 18   Ht 5' 9.5" (1.765 m)   Wt 73.9 kg   SpO2 98%   BMI 23.73 kg/m  Physical Exam Vitals and nursing note reviewed.  Constitutional:      General: She is not in acute distress.    Appearance: Normal appearance. She is well-developed.  HENT:     Head: Normocephalic and atraumatic.     Nose: Nose normal.  Eyes:     Pupils: Pupils are equal, round, and reactive to light.  Cardiovascular:     Rate and Rhythm: Normal rate and regular rhythm.     Pulses: Normal pulses.     Heart sounds: Normal  heart sounds.  Pulmonary:     Effort: Pulmonary effort is normal. No respiratory distress.     Breath sounds: Normal breath sounds.  Abdominal:     General: Bowel sounds are normal. There is no distension.     Palpations: Abdomen is soft.     Tenderness: There is no abdominal tenderness. There is no guarding or rebound.  Genitourinary:    Vagina: No vaginal discharge.  Musculoskeletal:        General: Normal range of motion.     Cervical back: Neck supple.  Skin:    General: Skin is warm and dry.     Capillary Refill: Capillary refill takes less than 2 seconds.     Findings: No erythema or rash.  Neurological:     General:  No focal deficit present.     Mental Status: She is alert and oriented to person, place, and time.     Cranial Nerves: No cranial nerve deficit.     Deep Tendon Reflexes: Reflexes normal.  Psychiatric:        Mood and Affect: Mood normal.        Behavior: Behavior normal.     ED Results / Procedures / Treatments   Labs (all labs ordered are listed, but only abnormal results are displayed) Labs Reviewed  I-STAT BETA HCG BLOOD, ED ([facility], [facility], [facility] ONLY)    EKG None  Radiology CT Head Wo Contrast  Result Date: 09/30/2021 CLINICAL DATA:  Facial trauma, penetrating. C/o migraine that began around 2000, pt states less than hour later her vision became blurry and has persisted. Hx migraines, last took sumatriptan 50 mg at 2052. Reports nausea, no vomiting. EXAM: CT HEAD WITHOUT CONTRAST TECHNIQUE: Contiguous axial images were obtained from the base of the skull through the vertex without intravenous contrast. RADIATION DOSE REDUCTION: This exam was performed according to the departmental dose-optimization program which includes automated exposure control, adjustment of the mA and/or kV according to patient size and/or use of iterative reconstruction technique. COMPARISON:  None Available. FINDINGS: Brain: No evidence of large-territorial acute infarction. No parenchymal hemorrhage. No mass lesion. No extra-axial collection. No mass effect or midline shift. No hydrocephalus. Basilar cisterns are patent. Vascular: No hyperdense vessel. Skull: No acute fracture or focal lesion. Sinuses/Orbits: Paranasal sinuses and mastoid air cells are clear. The orbits are unremarkable. Other: Right parietal scalp edema with no large hematoma formation. IMPRESSION: No acute intracranial abnormality. Electronically Signed   By: Tish Frederickson M.D.   On: 09/30/2021 23:51    Procedures Procedures    Medications Ordered in ED Medications  ketorolac (TORADOL) 30 MG/ML injection 30 mg (30 mg Intravenous Given 10/01/21  0107)  sodium chloride 0.9 % bolus 500 mL (0 mLs Intravenous Stopped 10/01/21 0213)  magnesium sulfate IVPB 2 g 50 mL (0 g Intravenous Stopped 10/01/21 0213)    ED Course/ Medical Decision Making/ A&P                           Medical Decision Making Migraine repeat   Problems Addressed: Other migraine without status migrainosus, not intractable: acute illness or injury    Details: cocktail given patient  Amount and/or Complexity of Data Reviewed External Data Reviewed: notes.    Details: previous notes reviewed Labs: ordered.    Details: negative preg test Radiology: ordered and independent interpretation performed.    Details: negative head CT  Risk Prescription drug management. Risk Details: Patient is markedly improved  post medication.  Well appearing stable for discharge    Final Clinical Impression(s) / ED Diagnoses Final diagnoses:  None  Return for intractable cough, coughing up blood, fevers > 100.4 unrelieved by medication, shortness of breath, intractable vomiting, chest pain, shortness of breath, weakness, numbness, changes in speech, facial asymmetry, abdominal pain, passing out, Inability to tolerate liquids or food, cough, altered mental status or any concerns. No signs of systemic illness or infection. The patient is nontoxic-appearing on exam and vital signs are within normal limits.  I have reviewed the triage vital signs and the nursing notes. Pertinent labs & imaging results that were available during my care of the patient were reviewed by me and considered in my medical decision making (see chart for details). After history, exam, and medical workup I feel the patient has been appropriately medically screened and is safe for discharge home. Pertinent diagnoses were discussed with the patient. Patient was given return precautions.      Tasmine Hipwell, MD 10/01/21 [id number]

## 2021-10-25 ENCOUNTER — Ambulatory Visit: Payer: No Typology Code available for payment source | Admitting: Internal Medicine

## 2021-10-25 VITALS — BP 100/78 | HR 71 | Temp 98.1°F | Wt 167.4 lb

## 2021-10-25 DIAGNOSIS — E559 Vitamin D deficiency, unspecified: Secondary | ICD-10-CM | POA: Diagnosis not present

## 2021-10-25 DIAGNOSIS — R7302 Impaired glucose tolerance (oral): Secondary | ICD-10-CM | POA: Diagnosis not present

## 2021-10-25 DIAGNOSIS — G43809 Other migraine, not intractable, without status migrainosus: Secondary | ICD-10-CM | POA: Diagnosis not present

## 2021-10-25 LAB — VITAMIN D 25 HYDROXY (VIT D DEFICIENCY, FRACTURES): VITD: 33.81 ng/mL (ref 30.00–100.00)

## 2021-10-25 LAB — POCT GLYCOSYLATED HEMOGLOBIN (HGB A1C): Hemoglobin A1C: 6.2 % — AB (ref 4.0–5.6)

## 2021-10-25 NOTE — Progress Notes (Signed)
Established Patient Office Visit     CC/Reason for Visit: 56-month follow-up chronic medical conditions  HPI: Diane Kirk is a 46 y.o. female who is coming in today for the above mentioned reasons. Past Medical History is significant for: SVT, impaired glucose tolerance, vitamin D deficiency and migraine headaches.  She still has some migraines but they are significantly decreased in frequency.  She is otherwise doing quite well.   Past Medical/Surgical History: Past Medical History:  Diagnosis Date   Anxiety    Complication of anesthesia    epidural did not work   Gastroesophageal reflux disease    Gestational diabetes    glyburide   Hemoglobin C trait (HCC)    Migraine headache    Psoriasis     Past Surgical History:  Procedure Laterality Date   APPENDECTOMY     BREAST CYST EXCISION     ivf     laproscopy     REFRACTIVE SURGERY Bilateral    sebaceous cyst resection     Scalp    Social History:  reports that she has never smoked. She has never used smokeless tobacco. She reports that she does not drink alcohol and does not use drugs.  Allergies: Allergies  Allergen Reactions   Azithromycin Other (See Comments)    Vertigo    Tape Rash    Creates a bumpy rash    Family History:  Family History  Problem Relation Age of Onset   Hypertension Mother    Hyperlipidemia Mother    Hypertension Father    Hyperlipidemia Father    Liver cancer Father        mets to colon and pancreas   Colon cancer Father    Stroke Maternal Uncle    Diabetes Paternal Aunt    Kidney disease Paternal Aunt    Heart disease Paternal Uncle 81   Parkinsonism Maternal Grandmother    Dementia Maternal Grandmother    Breast cancer Maternal Grandmother    Heart disease Maternal Grandfather    Esophageal cancer Neg Hx    Stomach cancer Neg Hx    Rectal cancer Neg Hx      Current Outpatient Medications:    diltiazem (CARDIZEM CD) 120 MG 24 hr capsule, Take 1 capsule (120 mg  total) by mouth daily., Disp: 90 capsule, Rfl: 3   gabapentin (NEURONTIN) 100 MG capsule, Take 1 capsule at bedtime for one week, then 1 capsule twice daily., Disp: 60 capsule, Rfl: 0   Ubrogepant (UBRELVY) 100 MG TABS, Take 1 tablet by mouth daily as needed (migraines)., Disp: , Rfl:    ibuprofen (ADVIL,MOTRIN) 600 MG tablet, Take 1 tablet (600 mg total) by mouth every 6 (six) hours. (Patient not taking: Reported on 10/01/2021), Disp: 30 tablet, Rfl: 0  Review of Systems:  Constitutional: Denies fever, chills, diaphoresis, appetite change and fatigue.  HEENT: Denies photophobia, eye pain, redness, hearing loss, ear pain, congestion, sore throat, rhinorrhea, sneezing, mouth sores, trouble swallowing, neck pain, neck stiffness and tinnitus.   Respiratory: Denies SOB, DOE, cough, chest tightness,  and wheezing.   Cardiovascular: Denies chest pain, palpitations and leg swelling.  Gastrointestinal: Denies nausea, vomiting, abdominal pain, diarrhea, constipation, blood in stool and abdominal distention.  Genitourinary: Denies dysuria, urgency, frequency, hematuria, flank pain and difficulty urinating.  Endocrine: Denies: hot or cold intolerance, sweats, changes in hair or nails, polyuria, polydipsia. Musculoskeletal: Denies myalgias, back pain, joint swelling, arthralgias and gait problem.  Skin: Denies pallor, rash and wound.  Neurological:  Denies dizziness, seizures, syncope, weakness, light-headedness, numbness and headaches.  Hematological: Denies adenopathy. Easy bruising, personal or family bleeding history  Psychiatric/Behavioral: Denies suicidal ideation, mood changes, confusion, nervousness, sleep disturbance and agitation    Physical Exam: Vitals:   10/25/21 1139  BP: 100/78  Pulse: 71  Temp: 98.1 F (36.7 C)  TempSrc: Oral  SpO2: 97%  Weight: 167 lb 6.4 oz (75.9 kg)    Body mass index is 24.37 kg/m.   Constitutional: NAD, calm, comfortable Eyes: PERRL, lids and conjunctivae  normal ENMT: Mucous membranes are moist.  Respiratory: clear to auscultation bilaterally, no wheezing, no crackles. Normal respiratory effort. No accessory muscle use.  Cardiovascular: Regular rate and rhythm, no murmurs / rubs / gallops. No extremity edema.  Psychiatric: Normal judgment and insight. Alert and oriented x 3. Normal mood.    Impression and Plan:  IGT (impaired glucose tolerance)  Vitamin D deficiency - Plan: VITAMIN D 25 Hydroxy (Vit-D Deficiency, Fractures)  Other migraine without status migrainosus, not intractable  -A1c is stable at 6.2, check vitamin D levels today.  She continues with her preventative and abortive medications for her migraines and follows routinely with neurology.  Time spent:31 minutes reviewing chart, interviewing and examining patient and formulating plan of care.    Chaya Jan, MD [facility]

## 2021-12-10 ENCOUNTER — Other Ambulatory Visit: Payer: Self-pay | Admitting: Internal Medicine

## 2022-06-08 IMAGING — DX DG ABDOMEN 1V
1 series · 1 of 1 positions shown · non-contrast
Comparison: None.

CLINICAL DATA: Radiating left flank pain with hematuria.

EXAM:
ABDOMEN - 1 VIEW

[abdomen supine ap]
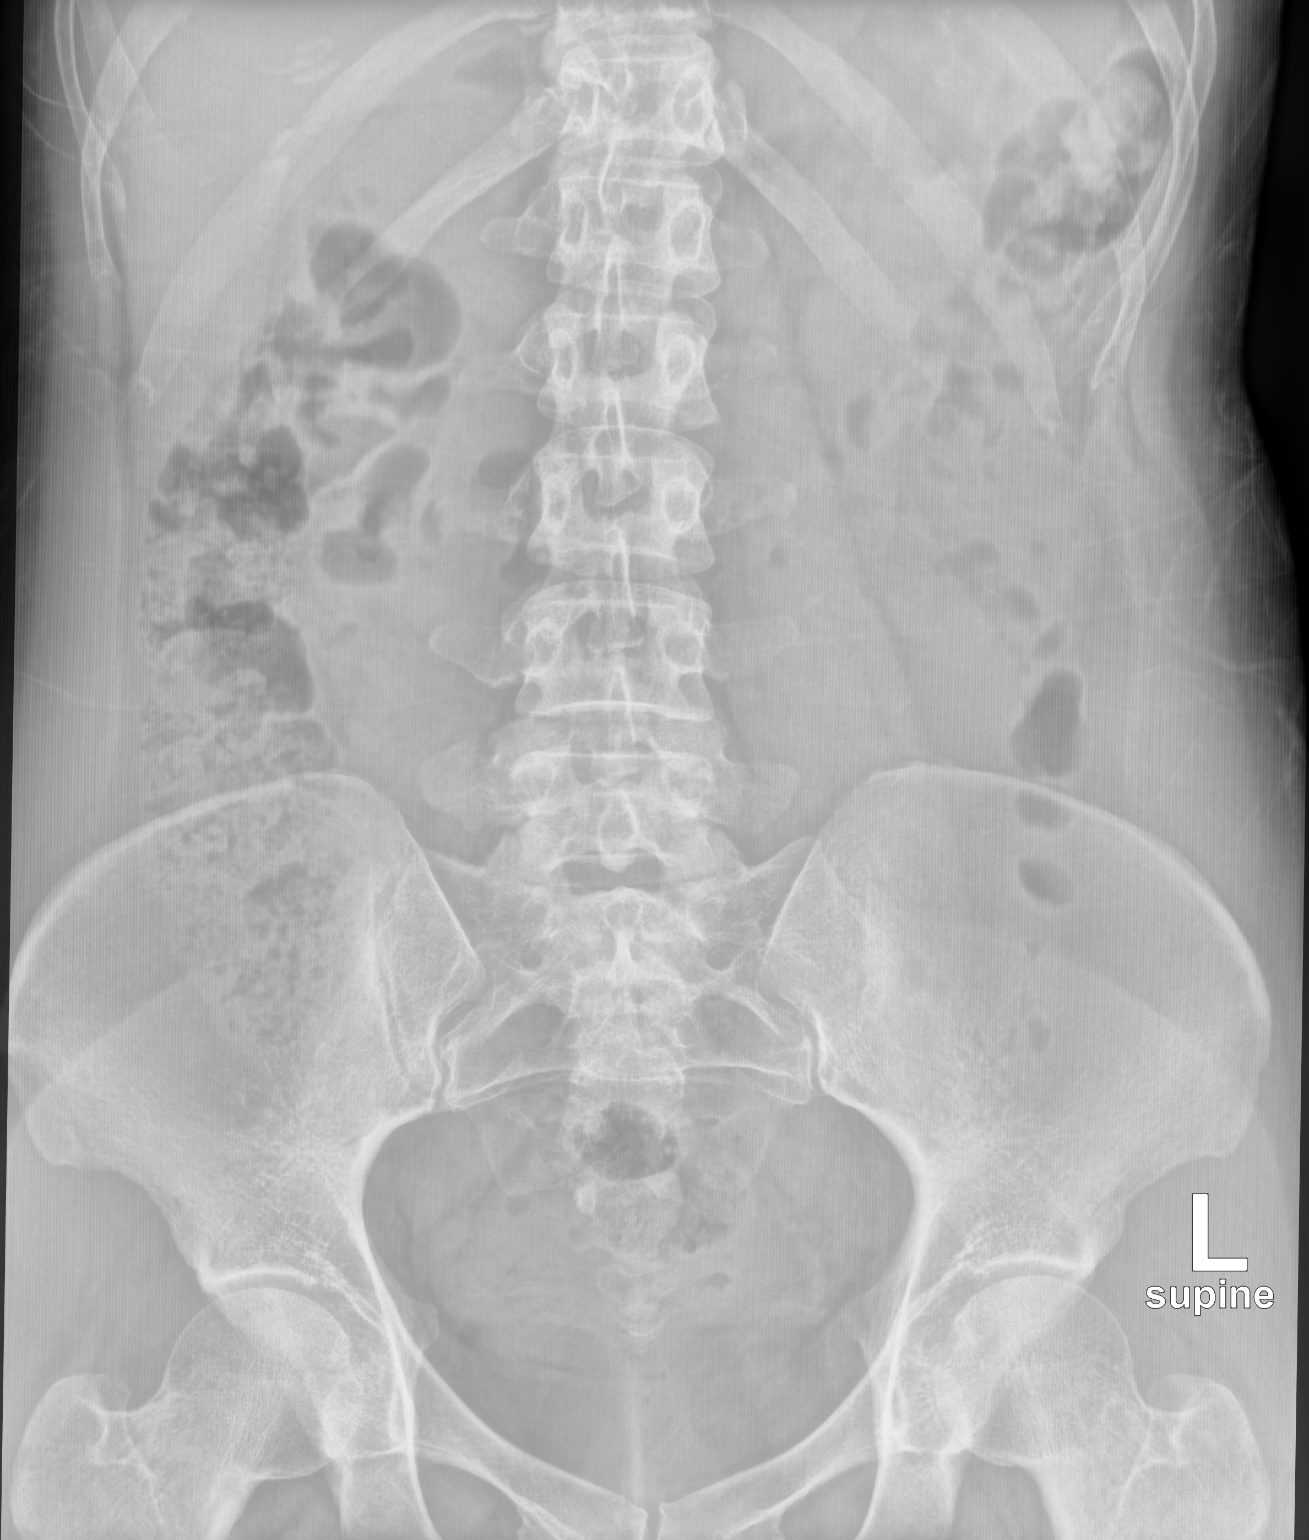

[1 of 1 positions shown; findings below may reference images not displayed]

FINDINGS: Nonobstructive bowel gas pattern. Possible 3-4 mm left ureteral
calculus, just superior to the left L4 spinous process. Grade
artifact and bowel gas limits evaluation.
IMPRESSION: Possible 3-4 mm left ureteral calculus, just superior to the left L4
spinous process. CT abdomen pelvis could further evaluate, if
clinically indicated.

## 2022-07-13 ENCOUNTER — Other Ambulatory Visit: Payer: Self-pay | Admitting: Obstetrics and Gynecology

## 2022-07-13 DIAGNOSIS — N926 Irregular menstruation, unspecified: Secondary | ICD-10-CM

## 2022-07-19 ENCOUNTER — Other Ambulatory Visit: Payer: Self-pay | Admitting: Obstetrics and Gynecology

## 2022-07-19 DIAGNOSIS — R928 Other abnormal and inconclusive findings on diagnostic imaging of breast: Secondary | ICD-10-CM

## 2022-08-01 ENCOUNTER — Ambulatory Visit
Admission: RE | Admit: 2022-08-01 | Discharge: 2022-08-01 | Disposition: A | Discharge status: Home / Self Care | Payer: No Typology Code available for payment source | Source: Ambulatory Visit | Attending: Obstetrics and Gynecology | Admitting: Obstetrics and Gynecology

## 2022-08-01 DIAGNOSIS — R928 Other abnormal and inconclusive findings on diagnostic imaging of breast: Secondary | ICD-10-CM

## 2022-08-05 ENCOUNTER — Ambulatory Visit
Admission: RE | Admit: 2022-08-05 | Discharge: 2022-08-05 | Disposition: A | Discharge status: Home / Self Care | Payer: No Typology Code available for payment source | Source: Ambulatory Visit | Attending: Obstetrics and Gynecology | Admitting: Obstetrics and Gynecology

## 2022-08-05 DIAGNOSIS — N926 Irregular menstruation, unspecified: Secondary | ICD-10-CM

## 2022-08-09 ENCOUNTER — Other Ambulatory Visit: Payer: Self-pay | Admitting: Obstetrics and Gynecology

## 2023-01-04 ENCOUNTER — Emergency Department (HOSPITAL_COMMUNITY)
Admission: EM | Admit: 2023-01-04 | Discharge: 2023-01-04 | Discharge status: Against Medical Advice | Payer: No Typology Code available for payment source | Source: Home / Self Care

## 2023-01-06 NOTE — Progress Notes (Signed)
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 Keep headache diary Will again request MRI cervical spine report from prior orthopedist. Follow up 6 months.    Subjective:  Nykiah Ma is a 47 year old female with psoriasis, SVT and anxiety who follows up for headaches.  UPDATE: Last seen in October 2022 for initial consult for headache.  Prescribed gabapentin at that time.  It helped but stopped after awhile.    Migraines started to increase in frequency again this year.  Still with right sided shooting pain from back of neck and head to right eye but also has pressure in the left eye.  Lasts up to an hour with Ubrelvy.  Occurs about 7 days a month.  When turning her head to the right, she has an electric pain radiating down the right arm.    She has had 3 bouts of vertigo this past year.  A spinning sensation elicited with head movement.  Wakes up with it.  Usually lasts up to a week.  Meclizine and the Epley maneuver helps.  Audiometric testing in June was normal.  She underwent a vestibular evaluation last month where the [facility] maneuver revealed mild but persistent left beating horizontal nystagmus but not severe enough to be clinically significant.  Overall, testing was negative for peripheral and central deficits.  During the day  she had vestibular testing, she had been experiencing a migraine headache.  For the entire day, she noted blue orbs in her vision, which was new.  No recurrence.    Current NSAIDS/analgesics:  ibuprofen Current triptans:  none Current ergotamine:  none Current anti-emetic:  none Current muscle relaxants:  none Current Antihypertensive medications:  diltiazem Current Antidepressant medications:  none Current Anticonvulsant medications:  none Current anti-CGRP:  Ubrelvy 100mg  Current Vitamins/Herbal/Supplements:  none Current Antihistamines/Decongestants:  none Other therapy:  none Hormone/birth control:  none   HISTORY: Onset:  Mild headaches since her early 31s.  Worse since 2019 after birth of her daughter Location:  start behind right eye and radiate to back of head at base of skull. Quality:  throbbing behind right eye, burning/shooting radiating to back of head Intensity:  5/10 Aura:  absent Prodrome:  absent Associated symptoms:  Sometimes left ptosis, photophobia and nausea.  She denies associated vomiting, phonophobia, visual disturbance, conjunctival injection, eye lacrimation, nasal congestion/rhinorrhea or unilateral numbness or weakness. Duration:  3 days, progresses from mild to severe.  With Bernita Raisin, within an hour Frequency:  Variable.  May happen twice a week or may skip a week (total 8 to 10 days a month) Triggers:  Unknown Relieving factors:  no Activity:  aggravates She does have associated neck pain. She saw orthopedics who told her that she had a pinched nerve in her neck that may respond to [facility].  She declined.   Past NSAIDS/analgesics:  ibuprofen, Tylenol Past abortive triptans:  rizatriptan, Zomig ZMT, sumatriptan 50mg  Past  abortive ergotamine:  none Past muscle relaxants:  Robaxin Past anti-emetic:  none Past antihypertensive medications:  metoprolol Past antidepressant medications:  amitriptyline Past anticonvulsant medications:  topiramate Past  anti-CGRP:  none Past vitamins/Herbal/Supplements:  none Past antihistamines/decongestants:  meclizine Other past therapies:  physical therapy for cervical pain   PAST MEDICAL HISTORY: Past Medical History:  Diagnosis Date   Anxiety    Complication of anesthesia    epidural did not work   Gastroesophageal reflux disease    Gestational diabetes    glyburide   Hemoglobin C trait (HCC)    Migraine headache    Psoriasis     MEDICATIONS: Current Outpatient Medications on File Prior to Visit  Medication Sig Dispense Refill   diltiazem (CARDIZEM CD) 120 MG 24 hr capsule Take 1 capsule (120 mg total) by mouth daily. 90 capsule 3   gabapentin (NEURONTIN) 100 MG capsule Take 1 capsule at bedtime for one week, then 1 capsule twice daily. 60 capsule 0   ibuprofen (ADVIL,MOTRIN) 600 MG tablet Take 1 tablet (600 mg total) by mouth every 6 (six) hours. (Patient not taking: Reported on 10/01/2021) 30 tablet 0   Ubrogepant (UBRELVY) 100 MG TABS Take 1 tablet by mouth daily as needed (migraines).     No current facility-administered medications on file prior to visit.    ALLERGIES: Allergies  Allergen Reactions   Azithromycin Other (See Comments)    Vertigo    Tape Rash    Creates a bumpy rash    FAMILY HISTORY: Family History  Problem Relation Age of Onset   Hypertension Mother    Hyperlipidemia Mother    Hypertension Father    Hyperlipidemia Father    Liver cancer Father        mets to colon and pancreas   Colon cancer Father    Stroke Maternal Uncle    Diabetes Paternal Aunt    Kidney disease Paternal Aunt    Heart disease Paternal Uncle 21   Parkinsonism Maternal Grandmother    Dementia Maternal Grandmother    Breast cancer Maternal Grandmother    Heart disease Maternal Grandfather    Esophageal cancer Neg Hx    Stomach cancer Neg Hx    Rectal cancer Neg Hx       Objective:  Blood pressure 112/70, pulse 88, resp. rate 18, height 5\' 10"  (1.778 m), weight 174 lb (78.9  kg), SpO2 98%, currently breastfeeding. General: No acute distress.  Patient appears well-groomed.   Head:  Normocephalic/atraumatic Eyes:  Fundi examined but not visualized Neck: supple, right paraspinal tenderness, full range of motion Heart:  Regular rate and rhythm Neurological Exam: alert and oriented.  Speech fluent and not dysarthric, language intact.  CN II-XII intact. Bulk and tone normal, muscle strength 5/5 throughout.  Sensation to light touch intact.  Deep tendon reflexes 2+ throughout, toes downgoing.  Finger to nose testing intact.  Gait normal, Romberg negative.   Shon Millet, DO  CC: Chaya Jan, MD

## 2023-01-09 ENCOUNTER — Ambulatory Visit: Payer: No Typology Code available for payment source | Admitting: Neurology

## 2023-01-09 ENCOUNTER — Encounter: Payer: Self-pay | Admitting: Neurology

## 2023-01-09 VITALS — BP 112/70 | HR 88 | Resp 18 | Ht 70.0 in | Wt 174.0 lb

## 2023-01-09 DIAGNOSIS — R42 Dizziness and giddiness: Secondary | ICD-10-CM | POA: Diagnosis not present

## 2023-01-09 DIAGNOSIS — G43009 Migraine without aura, not intractable, without status migrainosus: Secondary | ICD-10-CM

## 2023-01-09 DIAGNOSIS — M5412 Radiculopathy, cervical region: Secondary | ICD-10-CM | POA: Diagnosis not present

## 2023-01-09 DIAGNOSIS — H539 Unspecified visual disturbance: Secondary | ICD-10-CM | POA: Diagnosis not present

## 2023-01-09 MED ORDER — QULIPTA 60 MG PO TABS: 60.0000 mg | ORAL_TABLET | Freq: Every day | ORAL | 5 refills | Status: DC

## 2023-01-09 NOTE — Patient Instructions (Addendum)
Check MRI of brain with and without contrast. We have sent a referral to [facility] for your MRI and they will call you directly to schedule your appointment. They are located at [address] [facility]. If you need to contact them directly please call [phone].  Take Ubrelvy as needed. Plan to start Qulipta 60mg  daily Refer to physical therapy for neck pian Limit use of pain relievers to no more than 2 days out of week to prevent risk of rebound or medication-overuse headache. Keep headache diary Follow up 6 months.

## 2023-01-10 ENCOUNTER — Telehealth: Payer: Self-pay | Admitting: Pharmacy Technician

## 2023-01-10 ENCOUNTER — Other Ambulatory Visit (HOSPITAL_COMMUNITY): Payer: Self-pay

## 2023-01-10 NOTE — Telephone Encounter (Signed)
Pharmacy Patient Advocate Encounter  Received notification from [facility] that Prior Authorization for QULIPTA 60MG  has been APPROVED from 10.15.24 to 1.13.25   PA #/Case ID/Reference #: [id number]

## 2023-01-10 NOTE — Telephone Encounter (Signed)
Pharmacy Patient Advocate Encounter   Received notification from CoverMyMeds that prior authorization for QULIPTA 60MG  is required/requested.   Insurance verification completed.   The patient is insured through [employer] .   Per test claim: PA required; PA submitted to [facility] via CoverMyMeds Key/confirmation #/EOC [id number] Status is pending

## 2023-01-11 ENCOUNTER — Ambulatory Visit: Payer: No Typology Code available for payment source | Admitting: Physical Therapy

## 2023-01-16 ENCOUNTER — Ambulatory Visit: Payer: No Typology Code available for payment source | Attending: Neurology | Admitting: Physical Therapy

## 2023-01-16 DIAGNOSIS — M5412 Radiculopathy, cervical region: Secondary | ICD-10-CM | POA: Insufficient documentation

## 2023-01-16 DIAGNOSIS — M542 Cervicalgia: Secondary | ICD-10-CM | POA: Diagnosis present

## 2023-01-16 NOTE — Therapy (Signed)
OUTPATIENT PHYSICAL THERAPY CERVICAL EVALUATION   Patient Name: Diane Kirk MRN: [id number] DOB:09-06-1975, 47 y.o., female Today's Date: 01/16/2023  END OF SESSION:  PT End of Session - 01/16/23 1549     Visit Number 1    Number of Visits 5    Date for PT Re-Evaluation 02/15/23    Authorization Type U[employer]    PT Start Time 1449    PT Stop Time 1522    PT Time Calculation (min) 33 min    Activity Tolerance Patient tolerated treatment well    Behavior During Therapy WFL for tasks assessed/performed             Past Medical History:  Diagnosis Date   Anxiety    Complication of anesthesia    epidural did not work   Gastroesophageal reflux disease    Gestational diabetes    glyburide   Hemoglobin C trait (HCC)    Migraine headache    Psoriasis    Past Surgical History:  Procedure Laterality Date   APPENDECTOMY     BREAST CYST EXCISION     ivf     laproscopy     REFRACTIVE SURGERY Bilateral    sebaceous cyst resection     Scalp   Patient Active Problem List   Diagnosis Date Noted   IGT (impaired glucose tolerance) 10/23/2018   Anemia affecting pregnancy 10/23/2018   Vitamin D deficiency 10/23/2018   Gestational diabetes mellitus (GDM) affecting second pregnancy 10/06/2017   Gestational diabetes mellitus (GDM), antepartum 08/16/2017   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 Dr.  Everlena Cooper feels like the neck pain is starting to cause her headaches/migraines. Sometimes has symptoms going down her R arm. Not here for the dizziness, but rather the neck pain. Notes neck pain has gotten worse last year. Not sure what makes the pain worse, reports it just comes on. Sees an acupuncturist as needed, but trying to go more consistently. Also gets myofascial release on her  neck and reports she feels like that is helping. Probably has a migraine about 7 times a month and neck pain is always with her migraines. Has hx of R shoulder bursitis   Hand dominance: Right  PERTINENT HISTORY:  PMH: psoriasis, SVT and anxiety  Per Dr. 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  A spinning sensation elicited with head movement.  Wakes up with it.  Usually lasts up to a week.  Meclizine and the Epley maneuver helps.  Audiometric testing in June was normal.  She underwent a vestibular evaluation last month where the [facility] maneuver revealed mild but persistent left beating horizontal nystagmus but not severe enough to be clinically significant.  Overall, testing was negative for peripheral and central deficits.  During the day she had vestibular testing, she had been experiencing a migraine headache.  For the entire day, she noted blue orbs in her vision, which was new.  No recurrence.    PAIN:  Are you having pain? No  PRECAUTIONS: None   FALLS:  Has patient fallen in last 6 months? No   OCCUPATION: Is a realtor   PLOF: Independent  PATIENT GOALS: Feels like she has limited mobility looking R to L and wants to have more mobility.   NEXT MD VISIT: MRI brain scheduled 02/17/23  OBJECTIVE:  Note: Objective measures were completed at Evaluation  unless otherwise noted.  DIAGNOSTIC FINDINGS:  MRI brain scheduled 02/17/23  PATIENT SURVEYS:  NDI 7/50 = 14%  COGNITION: Overall cognitive status: Within functional limits for tasks assessed  SENSATION: Light touch: WFL  Pt reports will get random tingling sometimes down R arm   POSTURE: No Significant postural limitations  PALPATION: TTP: R>L levator scap, cervical paraspinals, suboccipitals    Hypomobility central  and right C2/C3, lower cervical and upper thoracic spine  CERVICAL ROM:   Active ROM A/PROM (deg) eval  Flexion 55  Extension 52  Right lateral flexion 38 (stiffness)  Left lateral flexion 45 (a little painful)  Right rotation 62  Left rotation 72   (Blank rows = not tested)  UPPER EXTREMITY ROM:  Active ROM Right eval Left eval  Shoulder flexion [facility]  Shoulder extension    Shoulder abduction [facility]  Shoulder adduction    Shoulder extension    Shoulder internal rotation WFL (stiffness due to hx of bursitis) WFL   Shoulder external rotation WFL (stiffness due to hx of bursitis) WFL  Elbow flexion    Elbow extension    Wrist flexion    Wrist extension    Wrist ulnar deviation    Wrist radial deviation    Wrist pronation    Wrist supination     (Blank rows = not tested)    TODAY'S TREATMENT:  DATE: 01/16/23  Trialed chirp wheel  for tight suboccipitals, pt indifferent towards chirp wheel   Access Code: [email or web address] URL: [email or web address] Date: 01/16/2023 Prepared by: Sherlie Ban  Provided gentle stretches below for HEP  Exercises - Gentle Levator Scapulae Stretch  - 2 x daily - 7 x weekly - 3 sets - 30 hold - Seated Cervical Sidebending Stretch  - 2 x daily - 7 x weekly - 3 sets - 30 hold  PATIENT EDUCATION:  Education details: Clinical findings, POC, initial HEP, trying dry needling in  a future session Person educated: Patient Education method: Explanation, Verbal cues, and Handouts Education comprehension: verbalized understanding and returned demonstration  HOME EXERCISE PROGRAM: Access Code: [email or web address] URL: [email or web address] Date: 01/16/2023 Prepared by: Sherlie Ban  Exercises - Gentle Levator Scapulae Stretch  - 2 x daily - 7 x weekly - 3 sets - 30 hold - Seated Cervical Sidebending Stretch  - 2 x daily - 7 x weekly - 3 sets - 30 hold  ASSESSMENT:  CLINICAL IMPRESSION:  Patient is a 47 year old female referred to Neuro OPPT for cervical radiculopathy. In chart, pt also with dizziness with migraines, but just wants to be treated for her neck pain.   Pt's PMH is significant for: hx of migraines. The following deficits were present during the exam: TTP R>L cervical paraspinals, suboccipitals, and levator scap, hypomobility of upper/lower cervical spine and upper thoracic spine, some pain with cervical AROM, decr cervical rotation AROM. Pt will benefit from a trial from dry needling. Pt would benefit from skilled PT to address these impairments and functional limitations to maximize functional mobility independence and decr pain.    OBJECTIVE IMPAIRMENTS: decreased mobility, decreased ROM, hypomobility, increased muscle spasms, impaired flexibility, impaired sensation, and pain.   ACTIVITY LIMITATIONS:  sleeping  PARTICIPATION LIMITATIONS:  N/A  PERSONAL FACTORS: 1 comorbidity: hx of migraines  are also affecting patient's functional outcome.   REHAB POTENTIAL: Good  CLINICAL DECISION MAKING: Stable/uncomplicated  EVALUATION COMPLEXITY: Low   GOALS: Goals reviewed with patient? Yes  SHORT TERM GOALS: ALL STGS = LTGS  LONG TERM GOALS: Target date: 02/13/2023  Pt will be independent with final HEP for neck pain/cervical mobility in order to build upon functional gains made in therapy. Baseline:  Goal status: INITIAL  2.  Pt will  decr NDI to 4 or less to demo improved disability with neck pain.  Baseline: 7/50 Goal status: INITIAL  3.  Pt will impove R cervical rotation AROM to at least 70 deg in order to demo improved rotation for driving.  Baseline: 62 Goal status: INITIAL  PLAN:  PT FREQUENCY: 1x/week  PT DURATION: 4 weeks  PLANNED INTERVENTIONS: 97164- PT Re-evaluation, 97110-Therapeutic exercises, 97530- Therapeutic activity, 97112- Neuromuscular re-education, 97535- Self Care, [id number]- Manual therapy, Patient/Family education, Dry Needling, and Moist heat  PLAN FOR NEXT SESSION: Try dry needling, add exercises for cervical AROM, and lower cervical and upper thoracic mobility    Drake Leach, PT, DPT 01/16/2023, 3:50 PM

## 2023-01-24 ENCOUNTER — Ambulatory Visit: Payer: No Typology Code available for payment source | Admitting: Physical Therapy

## 2023-01-24 DIAGNOSIS — M542 Cervicalgia: Secondary | ICD-10-CM | POA: Diagnosis not present

## 2023-01-24 NOTE — Therapy (Signed)
OUTPATIENT PHYSICAL THERAPY CERVICAL TREATMENT   Patient Name: Diane Kirk MRN: [id number] DOB:1975-07-12, 47 y.o., female Today's Date: 01/24/2023  END OF SESSION:  PT End of Session - 01/24/23 1536     Visit Number 2    Number of Visits 5    Date for PT Re-Evaluation 02/15/23    Authorization Type UHC    PT Start Time 1535   pt arrived late   PT Stop Time 1610    PT Time Calculation (min) 35 min    Activity Tolerance Patient tolerated treatment well    Behavior During Therapy WFL for tasks assessed/performed              Past Medical History:  Diagnosis Date   Anxiety    Complication of anesthesia    epidural did not work   Gastroesophageal reflux disease    Gestational diabetes    glyburide   Hemoglobin C trait (HCC)    Migraine headache    Psoriasis    Past Surgical History:  Procedure Laterality Date   APPENDECTOMY     BREAST CYST EXCISION     ivf     laproscopy     REFRACTIVE SURGERY Bilateral    sebaceous cyst resection     Scalp   Patient Active Problem List   Diagnosis Date Noted   IGT (impaired glucose tolerance) 10/23/2018   Anemia affecting pregnancy 10/23/2018   Vitamin D deficiency 10/23/2018   Gestational diabetes mellitus (GDM) affecting second pregnancy 10/06/2017   Gestational diabetes mellitus (GDM), antepartum 08/16/2017   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 Pt  does report that she has soreness in the back of her neck today, pain comes and goes. Pt reports she is currently having migraines 7 times per month, had one Saturday and it lasted less than an hour due to medication.  Hand dominance: Right  PERTINENT HISTORY:  PMH: psoriasis, SVT and anxiety  Per Dr. Everlena Cooper: Migraine without aura, without status migrainosus, not intractable, probably  cervicogenic Cervicalgia with right sided radiculopathy Vertigo - likely BPPV vs cervicogenic Visual disturbance (blue orbs) may have been migraine aura  She has had 3 bouts of vertigo this past year.  A spinning sensation elicited with head movement.  Wakes up with it.  Usually lasts up to a week.  Meclizine and the Epley maneuver helps.  Audiometric testing in June was normal.  She underwent a vestibular evaluation last month where the [facility] maneuver revealed mild but persistent left beating horizontal nystagmus but not severe enough to be clinically significant.  Overall, testing was negative for peripheral and central deficits.  During the day she had vestibular testing, she had been experiencing a migraine headache.  For the entire day, she noted blue orbs in her vision, which was new.  No recurrence.    PAIN:  Are you having pain? No  PRECAUTIONS: None   FALLS:  Has patient fallen in last 6 months? No   OCCUPATION: Is a realtor   PLOF: Independent  PATIENT GOALS: Feels like she has limited mobility looking R to L and wants to have more mobility.   NEXT MD VISIT: MRI brain scheduled 02/17/23  OBJECTIVE:  Note: Objective measures were completed at Evaluation unless otherwise noted.  DIAGNOSTIC FINDINGS:  MRI brain scheduled 02/17/23  PATIENT SURVEYS:  NDI 7/50 = 14%  COGNITION: Overall cognitive status: Within functional limits for tasks assessed  SENSATION: Light touch: WFL  Pt reports will get random tingling sometimes down R arm   POSTURE: No Significant postural  limitations  PALPATION: TTP: R>L levator scap, cervical paraspinals, suboccipitals    Hypomobility central  and right C2/C3, lower cervical and upper thoracic spine  CERVICAL ROM:   Active ROM A/PROM (deg) eval  Flexion 55  Extension 52  Right lateral flexion 38 (stiffness)  Left lateral flexion 45 (a little painful)  Right rotation 62  Left rotation 72   (Blank rows = not tested)  UPPER EXTREMITY ROM:  Active ROM Right eval Left eval  Shoulder flexion [facility]  Shoulder extension    Shoulder abduction [facility]  Shoulder adduction    Shoulder extension    Shoulder internal rotation WFL (stiffness due to hx of bursitis) WFL   Shoulder external rotation WFL (stiffness due to hx of bursitis) WFL  Elbow flexion    Elbow extension    Wrist flexion    Wrist extension    Wrist ulnar deviation    Wrist radial deviation    Wrist pronation    Wrist supination     (Blank rows = not tested)    TODAY'S TREATMENT:                                                                                                                              TherEx Seated SCM stretch 3 x 30 sec each B Attempted to have pt perform self suboccipital release, minimal changes in pain symptoms felt  Added  SCM stretch to HEP, see bolded below  TherAct Trigger Point Dry-Needling  Treatment instructions: Expect mild to moderate muscle soreness. S/S of pneumothorax if dry needled over a lung field, and to seek immediate medical attention should they occur. Patient verbalized understanding of these instructions and education.  Patient Consent Given: Yes Education handout provided: Yes Muscles treated: L and R suboccipitals, L and R splenius capitus, L and R upper traps/levator scap, L and R SCM Treatment response/outcome: deep ache/pressure; muscle twitch detected  Trigger Point Dry Needling  What is Trigger Point Dry Needling (DN)? DN is a physical therapy technique used to treat muscle pain and  dysfunction. Specifically, DN helps deactivate muscle trigger points (muscle knots).  A thin filiform needle is used to penetrate the skin and stimulate the underlying trigger point. The goal is for a local twitch response (LTR) to occur and for the trigger point to relax. No medication of any kind is injected during the procedure.   What Does Trigger Point Dry Needling Feel Like?  The procedure feels different for each individual patient. Some patients report that they do not actually feel the needle enter the skin and overall the process is not painful. Very mild bleeding may occur. However, many patients feel a deep cramping in the muscle in which the needle was inserted. This is the local twitch response.   How Will I feel after the treatment? Soreness is normal, and the onset of soreness may not occur for a few hours. Typically this soreness does not last longer than two days.  Bruising is uncommon, however; ice can be used to decrease any possible bruising.  In rare cases feeling tired or nauseous after the treatment is normal. In addition, your symptoms may get worse before they get better, this period will typically not last longer than 24 hours.   What Can I do After My Treatment? Increase your hydration by drinking more water for the next 24 hours. You may place ice or heat on the areas treated that have become sore, however, do not use heat on inflamed or bruised areas. Heat often brings more relief post needling. You can continue your regular activities, but vigorous activity is not recommended initially after the treatment for 24 hours. DN is best combined with other physical therapy such as strengthening, stretching, and other therapies.    PATIENT EDUCATION:  Education details: TPDN (see above), continue HEP and added to HEP Person educated: Patient Education method: Explanation, Verbal cues, and Handouts Education comprehension: verbalized understanding and returned  demonstration  HOME EXERCISE PROGRAM: Access Code: [email or web address] URL: [email or web address] Date: 01/16/2023 Prepared by: Sherlie Ban  Exercises - Gentle Levator Scapulae Stretch  - 2 x daily - 7 x weekly - 3 sets - 30 hold - Seated Cervical Sidebending Stretch  - 2 x daily - 7 x weekly - 3 sets - 30 hold - Sternocleidomastoid Stretch  - 2 x daily - 7 x weekly - 1 sets - 3 reps - 30 sec hold  ASSESSMENT:  CLINICAL IMPRESSION: Emphasis of skilled PT session on initiating TPDN to address ongoing pain and tightness in cervical and upper shoulder region. Pt with good tolerance to DN this session, can assess full response better next session. Pt again with minimal change in symptoms felt with pressure to suboccipital region, deferred further attempts to perform suboccipital release. Pt also noted to have trigger points in B SCM, performed TPDN to this region and added stretch to HEP. Pt continues to benefit from  skilled therapy services to work towards increased independence with management of pain symptoms. Continue POC.     OBJECTIVE IMPAIRMENTS: decreased mobility, decreased ROM, hypomobility, increased muscle spasms, impaired flexibility, impaired sensation, and pain.   ACTIVITY LIMITATIONS:  sleeping  PARTICIPATION LIMITATIONS:  N/A  PERSONAL FACTORS: 1 comorbidity: hx of migraines  are also affecting patient's functional outcome.   REHAB POTENTIAL: Good  CLINICAL DECISION MAKING: Stable/uncomplicated  EVALUATION COMPLEXITY: Low   GOALS: Goals reviewed with patient? Yes  SHORT TERM GOALS: ALL STGS = LTGS  LONG TERM GOALS: Target date: 02/13/2023  Pt will be independent with final HEP for neck pain/cervical mobility in order to build upon functional gains made in therapy. Baseline:  Goal status: INITIAL  2.  Pt will decr NDI to 4 or less to demo improved disability with neck pain.  Baseline: 7/50 Goal status: INITIAL  3.  Pt will impove R cervical  rotation AROM to at least 70 deg in order to demo improved rotation for driving.  Baseline: 62 Goal status: INITIAL  PLAN:  PT FREQUENCY: 1x/week  PT DURATION: 4 weeks  PLANNED INTERVENTIONS: 97164- PT Re-evaluation, 97110-Therapeutic exercises, 97530- Therapeutic activity, 97112- Neuromuscular re-education, 97535- Self Care, 29562- Manual therapy, Patient/Family education, Dry Needling, and Moist heat  PLAN FOR NEXT SESSION: how was response to dry needling?, add exercises for cervical AROM, and lower cervical and upper thoracic mobility   Peter Congo, PT Peter Congo, PT, DPT, CSRS  01/24/2023, 4:10 PM

## 2023-01-30 ENCOUNTER — Ambulatory Visit: Payer: No Typology Code available for payment source | Attending: Neurology | Admitting: Physical Therapy

## 2023-01-30 ENCOUNTER — Encounter: Payer: Self-pay | Admitting: Physical Therapy

## 2023-01-30 DIAGNOSIS — M542 Cervicalgia: Secondary | ICD-10-CM | POA: Diagnosis present

## 2023-01-30 NOTE — Therapy (Signed)
OUTPATIENT PHYSICAL THERAPY CERVICAL TREATMENT   Patient Name: Diane Kirk MRN: [id number] DOB:26-Oct-1975, 47 y.o., female Today's Date: 01/30/2023  END OF SESSION:  PT End of Session - 01/30/23 1533     Visit Number 3    Number of Visits 5    Date for PT Re-Evaluation 02/15/23    Authorization Type [employer]    PT Start Time 1533    PT Stop Time 1610    PT Time Calculation (min) 37 min    Activity Tolerance Patient tolerated treatment well    Behavior During Therapy WFL for tasks assessed/performed              Past Medical History:  Diagnosis Date   Anxiety    Complication of anesthesia    epidural did not work   Gastroesophageal reflux disease    Gestational diabetes    glyburide   Hemoglobin C trait (HCC)    Migraine headache    Psoriasis    Past Surgical History:  Procedure Laterality Date   APPENDECTOMY     BREAST CYST EXCISION     ivf     laproscopy     REFRACTIVE SURGERY Bilateral    sebaceous cyst resection     Scalp   Patient Active Problem List   Diagnosis Date Noted   IGT (impaired glucose tolerance) 10/23/2018   Anemia affecting pregnancy 10/23/2018   Vitamin D deficiency 10/23/2018   Gestational diabetes mellitus (GDM) affecting second pregnancy 10/06/2017   Gestational diabetes mellitus (GDM), antepartum 08/16/2017   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   Seasonal allergies 06/09/2014   Migraine 04/12/2012    PCP: Philip Aspen, Limmie Patricia, MD  REFERRING PROVIDER: Drema Dallas, DO  REFERRING DIAG: [phone] (ICD-10-CM) - Cervical radiculopathy  THERAPY DIAG:  Cervicalgia  Rationale for Evaluation and Treatment: Rehabilitation  ONSET DATE: 01/09/2023  SUBJECTIVE:                                                                                                                                                                                                         SUBJECTIVE STATEMENT:  Has not had a migraine since the last time she was here.  Reports neck is feeling looser since she was last here. Still trying to do her stretches.  Felt good after dry needling last time. Reports no instances of tingling down R arm. Noticing still difficulty with turning her head to the R  Hand dominance: Right  PERTINENT HISTORY:  PMH: psoriasis, SVT and anxiety  Per Dr. Everlena Cooper: Migraine without aura, without status migrainosus, not  intractable, probably cervicogenic Cervicalgia with right sided radiculopathy Vertigo - likely BPPV vs cervicogenic Visual disturbance (blue orbs) may have been migraine aura  She has had 3 bouts of vertigo this past year.  A spinning sensation elicited with head movement.  Wakes up with it.  Usually lasts up to a week.  Meclizine and the Epley maneuver helps.  Audiometric testing in June was normal.  She underwent a vestibular evaluation last month where the [facility] maneuver revealed mild but persistent left beating horizontal nystagmus but not severe enough to be clinically significant.  Overall, testing was negative for peripheral and central deficits.  During the day she had vestibular testing, she had been experiencing a migraine headache.  For the entire day, she noted blue orbs in her vision, which was new.  No recurrence.    PAIN:  Are you having pain? Yes, down the back of right side, 3/10    PRECAUTIONS: None   FALLS:  Has patient fallen in last 6 months? No   OCCUPATION: Is a realtor   PLOF: Independent  PATIENT GOALS: Feels like she has limited mobility looking R to L and wants to have more mobility.   NEXT MD VISIT: MRI brain scheduled 02/17/23  OBJECTIVE:  Note: Objective measures were completed at Evaluation unless otherwise noted.  DIAGNOSTIC FINDINGS:  MRI brain scheduled 02/17/23  PATIENT SURVEYS:  NDI 7/50 = 14%  COGNITION: Overall cognitive status: Within functional limits for tasks assessed  SENSATION: Light touch: WFL  Pt reports will get random tingling sometimes down R  arm   POSTURE: No Significant postural limitations  PALPATION: TTP: R>L levator scap, cervical paraspinals, suboccipitals    Hypomobility central  and right C2/C3, lower cervical and upper thoracic spine  CERVICAL ROM:   Active ROM A/PROM (deg) eval  Flexion 55  Extension 52  Right lateral flexion 38 (stiffness)  Left lateral flexion 45 (a little painful)  Right rotation 62  Left rotation 72   (Blank rows = not tested)  UPPER EXTREMITY ROM:  Active ROM Right eval Left eval  Shoulder flexion [facility]  Shoulder extension    Shoulder abduction [facility]  Shoulder adduction    Shoulder extension    Shoulder internal rotation WFL (stiffness due to hx of bursitis) WFL   Shoulder external rotation WFL (stiffness due to hx of bursitis) WFL  Elbow flexion    Elbow extension    Wrist flexion    Wrist extension    Wrist ulnar deviation    Wrist radial deviation    Wrist pronation    Wrist supination     (Blank rows = not tested)    TODAY'S TREATMENT:                                                                                                                                TherEx  UBE for warm-up to BUE/shoulders/upper back, for ROM 3 minutes forwards,  3 minutes backwards at gear 4.5  For improved thoracic/lower cervical spine mobility: Cat/cow 10 reps, pt reporting tightness in upper thoracic spine  Thread the needle 10 reps each side, pt reports not much of a stretch Supine open books, 10 reps each side, not much of a stretch  Cervical spine SNAGs 8 reps each side, pt reporting feeling it more in her arms than her neck  TheraCane to areas to R upper thoracic/scapular area that are tight, pt providing stable pressure or adding in gentle neck movements, pt reporting feeling good with this, showed pt where to purchase on [employer]  Lower cervical/upper thoracic and rhomboid stretch 3 x 30 seconds, pt reporting this stretch stretches what feels tight  Laying on half foam  roller vertically, performed arms out in a t and bringing them over head and back open for a chest stretch 10 reps   PATIENT EDUCATION:  Education details: Additions to HEP (cat cow, upper thoracic stretch), discussed proper positioning for better neck alignment when using computer/laptop at home, and where to purchase a theracane  Person educated: Patient Education method: Explanation, Verbal cues, and Handouts Education comprehension: verbalized understanding and returned demonstration  HOME EXERCISE PROGRAM: Access Code: [email or web address] URL: [email or web address] Date: 01/30/2023 Prepared by: Sherlie Ban  Exercises - Gentle Levator Scapulae Stretch  - 2 x daily - 7 x weekly - 3 sets - 30 hold - Seated Cervical Sidebending Stretch  - 2 x daily - 7 x weekly - 3 sets - 30 hold - Sternocleidomastoid Stretch  - 2 x daily - 7 x weekly - 1 sets - 3 reps - 30 sec hold - Cat Cow  - 2 x daily - 7 x weekly - 2 sets - 10 reps - Standing Lower Cervical and Upper Thoracic Stretch  - 2 x daily - 7 x weekly - 3 sets - 30 hold  ASSESSMENT:  CLINICAL IMPRESSION: Pt reporting a good response from dry needling last week and has not had any migraines in the past week since she was last here. Notes still more stiffness in R upper thoracic/scapular region and some more difficulty with turning head to the R. Trialed different exercises today for cervical mobility and stretches to upper trap/lower cervical region. Added appropriate exercises to HEP. Also showed pt the TheraCane to try to use with trigger points/tight areas at home. Pt reporting feeling good with this and showed where to purchase for home. Will continue per POC     OBJECTIVE IMPAIRMENTS: decreased mobility, decreased ROM, hypomobility, increased muscle spasms, impaired flexibility, impaired sensation, and pain.   ACTIVITY LIMITATIONS:  sleeping  PARTICIPATION LIMITATIONS:  N/A  PERSONAL FACTORS: 1 comorbidity: hx of migraines   are also affecting patient's functional outcome.   REHAB POTENTIAL: Good  CLINICAL DECISION MAKING: Stable/uncomplicated  EVALUATION COMPLEXITY: Low   GOALS: Goals reviewed with patient? Yes  SHORT TERM GOALS: ALL STGS = LTGS  LONG TERM GOALS: Target date: 02/13/2023  Pt will be independent with final HEP for neck pain/cervical mobility in order to build upon functional gains made in therapy. Baseline:  Goal status: INITIAL  2.  Pt will decr NDI to 4 or less to demo improved disability with neck pain.  Baseline: 7/50 Goal status: INITIAL  3.  Pt will impove R cervical rotation AROM to at least 70 deg in order to demo improved rotation for driving.  Baseline: 62 Goal status: INITIAL  PLAN:  PT FREQUENCY: 1x/week  PT DURATION: 4 weeks  PLANNED INTERVENTIONS:  16109- PT Re-evaluation, 97110-Therapeutic exercises, 97530- Therapeutic activity, [id number]- Neuromuscular re-education, 97535- Self Care, [id number]- Manual therapy, Patient/Family education, Dry Needling, and Moist heat  PLAN FOR NEXT SESSION: do dry needling, how are exercises, potentially D/C or add one more visit?    Sherlie Ban, PT, DPT 01/30/23 4:17 PM

## 2023-02-06 ENCOUNTER — Ambulatory Visit: Payer: No Typology Code available for payment source | Admitting: Physical Therapy

## 2023-02-07 ENCOUNTER — Ambulatory Visit: Payer: No Typology Code available for payment source | Admitting: Physical Therapy

## 2023-02-17 ENCOUNTER — Ambulatory Visit
Admission: RE | Admit: 2023-02-17 | Discharge: 2023-02-17 | Disposition: A | Discharge status: Home / Self Care | Payer: No Typology Code available for payment source | Source: Ambulatory Visit | Attending: Neurology

## 2023-02-17 DIAGNOSIS — R42 Dizziness and giddiness: Secondary | ICD-10-CM

## 2023-02-17 DIAGNOSIS — H539 Unspecified visual disturbance: Secondary | ICD-10-CM

## 2023-02-17 DIAGNOSIS — G43009 Migraine without aura, not intractable, without status migrainosus: Secondary | ICD-10-CM

## 2023-02-17 MED ORDER — GADOPICLENOL 0.5 MMOL/ML IV SOLN
8.0000 mL | Freq: Once | INTRAVENOUS | Status: AC | PRN
  Administered 2023-02-17: 8 mL via INTRAVENOUS

## 2023-02-22 NOTE — Progress Notes (Signed)
Patient advised of MRI results.

## 2023-05-04 ENCOUNTER — Encounter: Payer: Self-pay | Admitting: Physical Therapy

## 2023-06-01 ENCOUNTER — Ambulatory Visit: Payer: Commercial Managed Care - PPO | Admitting: Internal Medicine

## 2023-06-05 ENCOUNTER — Ambulatory Visit: Payer: Commercial Managed Care - PPO | Admitting: Internal Medicine

## 2023-06-05 ENCOUNTER — Other Ambulatory Visit: Payer: Self-pay | Admitting: Internal Medicine

## 2023-06-05 DIAGNOSIS — Z124 Encounter for screening for malignant neoplasm of cervix: Secondary | ICD-10-CM

## 2023-06-06 ENCOUNTER — Ambulatory Visit: Admitting: Internal Medicine

## 2023-06-16 ENCOUNTER — Other Ambulatory Visit (HOSPITAL_COMMUNITY): Payer: Self-pay

## 2023-07-07 NOTE — Progress Notes (Deleted)
 NEUROLOGY FOLLOW UP OFFICE NOTE  Diane Kirk [id number]  Assessment/Plan:  Migraine without aura, without status migrainosus, not intractable, probably cervicogenic Cervicalgia with right sided radiculopathy Vertigo - likely BPPV vs cervicogenic Visual disturbance (blue orbs) may have been migraine aura   Migraine prevention:  Qulipta 60mg  daily *** Migraine rescue:  Bernita Raisin as needed *** Limit use of pain relievers to no more than 2 days out of week to prevent risk of rebound or medication-overuse headache. Keep headache diary Follow up 6 months.    Subjective:  Diane Kirk is a 48 year old female with psoriasis, SVT and anxiety who follows up for headaches.  MRI of brain personally reviewed.  UPDATE: MRI of brain with and without contrast on 02/17/2023 showed few tiny nonspecific T2 FLAIR hyperintensities within the right frontal lobe white matter and right frontal lobe developmental venous anomaly but otherwise unremarkable.  Referred to PT for neck pain and started Qulipta. ***    Current NSAIDS/analgesics:  ibuprofen Current triptans:  none Current ergotamine:  none Current anti-emetic:  none Current muscle relaxants:  none Current Antihypertensive medications:  diltiazem Current Antidepressant medications:  none Current Anticonvulsant medications:  none Current anti-CGRP:  Qulipta 60mg  daily, Ubrelvy 100mg  Current Vitamins/Herbal/Supplements:  none Current Antihistamines/Decongestants:  none Other therapy:  none Hormone/birth control:  none   HISTORY: Onset:  Mild headaches since her early 58s.  Worse since 2019 after birth of her daughter Location:  start behind right eye and radiate to back of head at base of skull. Quality:  throbbing behind right eye, burning/shooting radiating to back of head Intensity:  5/10 Aura:  absent Prodrome:  absent Associated symptoms:  Sometimes left ptosis, photophobia and nausea.  She denies associated vomiting,  phonophobia, visual disturbance, conjunctival injection, eye lacrimation, nasal congestion/rhinorrhea or unilateral numbness or weakness. Duration:  3 days, progresses from mild to severe.  With Bernita Raisin, within an hour Frequency:  Variable.  May happen twice a week or may skip a week (total 8 to 10 days a month) Triggers:  Unknown Relieving factors:  no Activity:  aggravates She does have associated neck pain. She saw orthopedics who told her that she had a pinched nerve in her neck that may respond to [facility].  She declined.  Migraines started to increase in frequency again in 2024.  Still with right sided shooting pain from back of neck and head to right eye but also has pressure in the left eye.  Lasts up to an hour with Ubrelvy.  Occurs about 7 days a month.  When turning her head to the right, she has an electric pain radiating down the right arm.    She has had 3 bouts of vertigo in 2024 as well.  A spinning sensation elicited with head movement.  Wakes up with it.  Usually lasts up to a week.  Meclizine and the Epley maneuver helps.  Audiometric testing in June was normal.  She underwent a vestibular evaluation last month where the [facility] maneuver revealed mild but persistent left beating horizontal nystagmus but not severe enough to be clinically significant.  Overall, testing was negative for peripheral and central deficits.  During the day she had vestibular testing, she had been experiencing a migraine headache.  For the entire day, she noted blue orbs in her vision, which was new.  No recurrence.     Past NSAIDS/analgesics:  ibuprofen, Tylenol Past abortive triptans:  rizatriptan, Zomig ZMT, sumatriptan 50mg  Past abortive ergotamine:  none Past muscle relaxants:  Robaxin Past anti-emetic:  none Past antihypertensive medications:  metoprolol Past antidepressant medications:  amitriptyline Past anticonvulsant medications:  topiramate Past anti-CGRP:  none Past  vitamins/Herbal/Supplements:  none Past antihistamines/decongestants:  meclizine Other past therapies:  physical therapy for cervical pain   PAST MEDICAL HISTORY: Past Medical History:  Diagnosis Date   Anxiety    Complication of anesthesia    epidural did not work   Gastroesophageal reflux disease    Gestational diabetes    glyburide   Hemoglobin C trait (HCC)    Migraine headache    Psoriasis     MEDICATIONS: Current Outpatient Medications on File Prior to Visit  Medication Sig Dispense Refill   Atogepant (QULIPTA) 60 MG TABS Take 1 tablet (60 mg total) by mouth daily. 30 tablet 5   diltiazem (CARDIZEM CD) 120 MG 24 hr capsule Take 1 capsule (120 mg total) by mouth daily. (Patient not taking: Reported on 01/16/2023) 90 capsule 3   gabapentin (NEURONTIN) 100 MG capsule Take 1 capsule at bedtime for one week, then 1 capsule twice daily. 60 capsule 0   ibuprofen (ADVIL,MOTRIN) 600 MG tablet Take 1 tablet (600 mg total) by mouth every 6 (six) hours. (Patient not taking: Reported on 10/01/2021) 30 tablet 0   Ubrogepant (UBRELVY) 100 MG TABS Take 1 tablet by mouth daily as needed (migraines).     No current facility-administered medications on file prior to visit.    ALLERGIES: Allergies  Allergen Reactions   Azithromycin Other (See Comments)    Vertigo    Tape Rash    Creates a bumpy rash    FAMILY HISTORY: Family History  Problem Relation Age of Onset   Hypertension Mother    Hyperlipidemia Mother    Hypertension Father    Hyperlipidemia Father    Liver cancer Father        mets to colon and pancreas   Colon cancer Father    Stroke Maternal Uncle    Diabetes Paternal Aunt    Kidney disease Paternal Aunt    Heart disease Paternal Uncle 40   Parkinsonism Maternal Grandmother    Dementia Maternal Grandmother    Breast cancer Maternal Grandmother    Heart disease Maternal Grandfather    Esophageal cancer Neg Hx    Stomach cancer Neg Hx    Rectal cancer Neg Hx        Objective:  *** General: No acute distress.  Patient appears well-groomed.   ***   Shon Millet, DO  CC: Chaya Jan, MD

## 2023-07-10 ENCOUNTER — Ambulatory Visit: Payer: No Typology Code available for payment source | Admitting: Neurology

## 2023-08-24 NOTE — Progress Notes (Unsigned)
 NEUROLOGY FOLLOW UP OFFICE NOTE  Diane Kirk [id number]  Assessment/Plan:  Migraine without aura, without status migrainosus, not intractable, probably cervicogenic Cervicalgia with right sided radiculopathy Visual disturbance (blue orbs) may have been migraine aura   As she has failed both physical therapy and pharmacologic management of cervical radiculopathy, will check MRI of cervical spine to see if she may benefit from an epidural injection Migraine prevention:  Plan to start Aimovig 140mg  every 28 days Migraine rescue:  Diane Kirk as needed Limit use of pain relievers to no more than 2 days out of week to prevent risk of rebound or medication-overuse headache. Keep headache diary Follow up 6 months.    Subjective:  Diane Kirk is a 48 year old female with psoriasis, SVT and anxiety who follows up for headaches.  MRI of brain personally reviewed.  UPDATE: MRI of brain with and without contrast on 02/17/2023 revealed a few tiny nonspecific T2 FLAIR hyperintensies within the right frontal lobe white matter and a right frontal lobe developmental venous anomaly but otherwise unremarkable.  Referred to PT for neck pain/radiculopathy and started on Qulipta .  Stopped Qulipta  because it caused palpitations.  PT helped but cannot continue PT.   Duration:  within one hour with Diane Kirk Frequency:  10 days a month.  Current NSAIDS/analgesics:  ibuprofen , may use Excedrin Migraine (not as effective as Diane Kirk) Current triptans:  none Current ergotamine:  none Current anti-emetic:  none Current muscle relaxants:  none Current Antihypertensive medications:  diltiazem  Current Antidepressant medications:  none Current Anticonvulsant medications:  none Current anti-CGRP:  Ubrelvy 100mg  Current Vitamins/Herbal/Supplements:  none Current Antihistamines/Decongestants:  none Other therapy:  physical therapy Hormone/birth control:  none   HISTORY: Migraines Onset:  Mild headaches since  her early 70s.  Worse since 2019 after birth of her daughter Location:  start behind right eye and radiate to back of head at base of skull. Quality:  throbbing behind right eye, burning/shooting radiating to back of head Intensity:  5/10 Aura:  absent Prodrome:  absent Associated symptoms:  Sometimes left ptosis, photophobia and nausea.  She denies associated vomiting, phonophobia, visual disturbance, conjunctival injection, eye lacrimation, nasal congestion/rhinorrhea or unilateral numbness or weakness. Duration:  3 days, progresses from mild to severe.  With Diane Kirk, within an hour Frequency:  Variable.  May happen twice a week or may skip a week (total 8 to 10 days a month) Triggers:  Unknown Relieving factors:  no Activity:  aggravates  Neck pain/right cervical radiculopathy She does have associated neck pain. She saw orthopedics who told her that she had a pinched nerve in her neck that may respond to [facility].  She declined.  In 2022, started gabapentin  and migraines declined. She eventually discontinued it and was doing well until 2024 when they returned and started increasing in frequency.  Still with right sided shooting pain from back of neck and head to right eye but also has pressure in the left eye.  Lasts up to an hour with Ubrelvy.  Occurs about 7 days a month.  When turning her head to the right, she has an electric pain radiating down the right arm.    Vertigo:  BPPV She has had 3 bouts of vertigo in 2024.  A spinning sensation elicited with head movement.  Wakes up with it.  Usually lasts up to a week.  Meclizine  and the Epley maneuver helps.  Audiometric testing in June 2024 was normal.  She underwent a vestibular evaluation last month where the [employer]  maneuver revealed mild but persistent left beating horizontal nystagmus but not severe enough to be clinically significant.  Overall, testing was negative for peripheral and central deficits.  During the day she had vestibular  testing, she had been experiencing a migraine headache.  For the entire day, she noted blue orbs in her vision, which was new.  No recurrence.     Past NSAIDS/analgesics:  ibuprofen , Tylenol  Past abortive triptans:  rizatriptan , Zomig ZMT, sumatriptan  50mg  Past abortive ergotamine:  none Past muscle relaxants:  Robaxin Past anti-emetic:  none Past antihypertensive medications:  metoprolol  Past antidepressant medications:  amitriptyline Past anticonvulsant medications:  topiramate, gabapentin  Past anti-CGRP:  Qulipta  60mg  (palpitations) Past vitamins/Herbal/Supplements:  none Past antihistamines/decongestants:  meclizine  Other past therapies:  physical therapy for cervical pain   PAST MEDICAL HISTORY: Past Medical History:  Diagnosis Date   Anxiety    Complication of anesthesia    epidural did not work   Gastroesophageal reflux disease    Gestational diabetes    glyburide   Hemoglobin C trait (HCC)    Migraine headache    Psoriasis     MEDICATIONS: Current Outpatient Medications on File Prior to Visit  Medication Sig Dispense Refill   Atogepant  (QULIPTA ) 60 MG TABS Take 1 tablet (60 mg total) by mouth daily. 30 tablet 5   diltiazem  (CARDIZEM  CD) 120 MG 24 hr capsule Take 1 capsule (120 mg total) by mouth daily. (Patient not taking: Reported on 01/16/2023) 90 capsule 3   gabapentin  (NEURONTIN ) 100 MG capsule Take 1 capsule at bedtime for one week, then 1 capsule twice daily. 60 capsule 0   ibuprofen  (ADVIL ,MOTRIN ) 600 MG tablet Take 1 tablet (600 mg total) by mouth every 6 (six) hours. (Patient not taking: Reported on 10/01/2021) 30 tablet 0   Ubrogepant  (UBRELVY ) 100 MG TABS Take 1 tablet by mouth daily as needed (migraines).     No current facility-administered medications on file prior to visit.    ALLERGIES: Allergies  Allergen Reactions   Azithromycin Other (See Comments)    Vertigo    Tape Rash    Creates a bumpy rash    FAMILY HISTORY: Family History  Problem  Relation Age of Onset   Hypertension Mother    Hyperlipidemia Mother    Hypertension Father    Hyperlipidemia Father    Liver cancer Father        mets to colon and pancreas   Colon cancer Father    Stroke Maternal Uncle    Diabetes Paternal Aunt    Kidney disease Paternal Aunt    Heart disease Paternal Uncle 48   Parkinsonism Maternal Grandmother    Dementia Maternal Grandmother    Breast cancer Maternal Grandmother    Heart disease Maternal Grandfather    Esophageal cancer Neg Hx    Stomach cancer Neg Hx    Rectal cancer Neg Hx       Objective:  Blood pressure 113/72, pulse 76, height 5\' 10"  (1.778 m), weight 191 lb (86.6 kg), SpO2 98%, currently breastfeeding. General: No acute distress.  Patient appears well-groomed.     Janne Members, DO  CC: Marguerita Shih, MD

## 2023-08-25 ENCOUNTER — Ambulatory Visit: Admitting: Neurology

## 2023-08-25 ENCOUNTER — Encounter: Payer: Self-pay | Admitting: Neurology

## 2023-08-25 VITALS — BP 113/72 | HR 76 | Ht 70.0 in | Wt 191.0 lb

## 2023-08-25 DIAGNOSIS — M5412 Radiculopathy, cervical region: Secondary | ICD-10-CM

## 2023-08-25 DIAGNOSIS — G43009 Migraine without aura, not intractable, without status migrainosus: Secondary | ICD-10-CM | POA: Diagnosis not present

## 2023-08-25 MED ORDER — AIMOVIG 140 MG/ML ~~LOC~~ SOAJ: 140.0000 mg | SUBCUTANEOUS | 11 refills | Status: DC

## 2023-08-25 MED ORDER — UBRELVY 100 MG PO TABS: 1.0000 | ORAL_TABLET | ORAL | 11 refills | Status: DC | PRN

## 2023-08-25 NOTE — Patient Instructions (Signed)
 Start Aimovig injection every 28 days.  If no improvement after 3 months of treatment, contact me Florette Hurry as needed MRI of cervical spine Limit use of pain relievers to no more than 9 days out of the month to prevent risk of rebound or medication-overuse headache. Keep headache diary Follow up 6 months.

## 2023-08-29 ENCOUNTER — Telehealth: Payer: Self-pay | Admitting: Neurology

## 2023-08-29 NOTE — Telephone Encounter (Signed)
 Patient wanted to know if she could get a LP done see if there is any fluid build up.   Advised patient DR.Festus Hubert will offer an LP if patient show signs and symptoms of the build up.  Per patient she had an eye exam a couple of months ago and she was no pressure shown on eye. Patient complains of a little eye pain.

## 2023-08-29 NOTE — Telephone Encounter (Signed)
 Left a message with the after hour service on 08-29-23   Caller states that she was seen on Friday and the auto injection shot was going to be called in for her migraine. The pharmacy is stating that it is needing a PA with the insurance. She wants to know if the office can work on that for her so she can get the approval and start the medication. She states that she would like a call back to make sure that this is taking care of   They did not get the name of the medication

## 2023-08-30 ENCOUNTER — Telehealth: Payer: Self-pay

## 2023-08-30 NOTE — Telephone Encounter (Signed)
 Patient called back PA is needed for Aimovig .

## 2023-08-30 NOTE — Telephone Encounter (Signed)
 Patient advised per [facility], It is not indicated

## 2023-09-01 ENCOUNTER — Telehealth: Payer: Self-pay | Admitting: Neurology

## 2023-09-01 ENCOUNTER — Encounter: Payer: Self-pay | Admitting: Neurology

## 2023-09-01 NOTE — Telephone Encounter (Signed)
 [facility] cld stated Pt's appt was cncld due to insurance and will be resched as soon as insurance PA is approved

## 2023-09-01 NOTE — Telephone Encounter (Signed)
 Pt MRI is pending due to insurance ,

## 2023-09-03 ENCOUNTER — Other Ambulatory Visit

## 2023-09-04 ENCOUNTER — Other Ambulatory Visit (HOSPITAL_COMMUNITY): Payer: Self-pay

## 2023-09-04 ENCOUNTER — Telehealth: Payer: Self-pay

## 2023-09-04 NOTE — Telephone Encounter (Signed)
See PA encounter

## 2023-09-04 NOTE — Telephone Encounter (Signed)
 Patient is calling back about the prior auth of the Aimovig . She states that the ins does not have a prior auth for the medication

## 2023-09-04 NOTE — Telephone Encounter (Signed)
 Pharmacy Patient Advocate Encounter   Received notification from Pt Calls Messages that prior authorization for Aimovig  140MG /ML auto-injectors is required/requested.   Insurance verification completed.   The patient is insured through [facility] .  Prior Authorization for Aimovig  140MG /ML auto-injectors has been APPROVED from 09-04-2023 to 12-03-2023   PA #/Case ID/Reference #: [id number]

## 2023-10-03 ENCOUNTER — Ambulatory Visit
Admission: RE | Admit: 2023-10-03 | Discharge: 2023-10-03 | Disposition: A | Discharge status: Home / Self Care | Source: Ambulatory Visit | Attending: Neurology | Admitting: Neurology

## 2023-10-03 DIAGNOSIS — M5412 Radiculopathy, cervical region: Secondary | ICD-10-CM

## 2023-10-05 ENCOUNTER — Ambulatory Visit: Payer: Self-pay | Admitting: Neurology

## 2023-10-05 DIAGNOSIS — M5412 Radiculopathy, cervical region: Secondary | ICD-10-CM

## 2023-10-05 NOTE — Telephone Encounter (Signed)
-----   Message from Juliene Lamar Dunnings sent at 10/05/2023 12:19 PM EDT ----- MRI shows mild arthritis and possibly a couple of areas where a pinched nerve is possible, but it would affect the left arm, not the right arm.  I am not seeing anything obvious to explain the right  arm.   We can refer her to Sports Medicine for any other possible treatment of the right sided neck pain ----- Message ----- From: Interface, Rad Results In Sent: 10/04/2023  12:00 PM EDT To: Juliene JONELLE Dunnings, DO

## 2023-10-05 NOTE — Telephone Encounter (Signed)
 Patient wants to know if the pinched nerve is the cause of the migraines.   Patient agree to the referral to sports medicine.

## 2023-10-06 NOTE — Telephone Encounter (Signed)
-----   Message from Diane Lamar Kirk sent at 10/05/2023  4:59 PM EDT ----- no ----- Message ----- From: Ozell Jesusa PARAS, CMA Sent: 10/05/2023   4:09 PM EDT To: Diane JONELLE Dunnings, DO  ----- Message from Jesusa PARAS Ozell, CMA sent at 10/05/2023  4:09 PM EDT -----   ----- Message ----- From: Kirk Diane JONELLE, DO Sent: 10/05/2023  12:19 PM EDT To: [facility] Clinical Pool  MRI shows mild arthritis and possibly a couple of areas where a pinched nerve is possible, but it would affect the left arm, not the right arm.  I am not seeing anything obvious to explain the right  arm.   We can refer her to Sports Medicine for any other possible treatment of the right sided neck pain ----- Message ----- From: Interface, Rad Results In Sent: 10/04/2023  12:00 PM EDT To: Diane JONELLE Dunnings, DO

## 2023-11-03 ENCOUNTER — Telehealth: Payer: Self-pay | Admitting: Pharmacy Technician

## 2023-11-03 NOTE — Telephone Encounter (Signed)
 Pharmacy Patient Advocate Encounter   Received notification from CoverMyMeds that prior authorization for AIMOVIG  140MG  is required/requested.   Insurance verification completed.   The patient is insured through [facility] .   Per test claim: PA required; PA submitted to above mentioned insurance via CoverMyMeds Key/confirmation #/EOC [id number] Status is pending

## 2023-12-20 ENCOUNTER — Telehealth: Payer: Self-pay | Admitting: Pharmacy Technician

## 2023-12-20 ENCOUNTER — Other Ambulatory Visit (HOSPITAL_COMMUNITY): Payer: Self-pay

## 2023-12-20 NOTE — Telephone Encounter (Signed)
 Pharmacy Patient Advocate Encounter  Received notification from [facility] that Prior Authorization for AIMOVIG  140MG  has been APPROVED from 9.24.25 to 9.24.26. Unable to obtain price due to refill too soon rejection, last fill date 9.22.25 next available fill date11.8.25   PA #/Case ID/Reference #: [id number]

## 2023-12-20 NOTE — Telephone Encounter (Signed)
 Pharmacy Patient Advocate Encounter   Received notification from Fax that prior authorization for AIMOVIG  140MG  is required/requested.   Insurance verification completed.   The patient is insured through [facility] .   Per test claim: PA required; PA submitted to above mentioned insurance via Latent Key/confirmation #/EOC [id number] Status is pending

## 2024-01-04 LAB — HM MAMMOGRAPHY

## 2024-01-06 LAB — LAB REPORT - SCANNED
A1c: 6.4
EGFR: 95
TSH: 0.35 — AB (ref 0.41–5.90)

## 2024-01-08 LAB — HM PAP SMEAR

## 2024-03-07 ENCOUNTER — Ambulatory Visit: Admitting: Neurology

## 2024-03-08 ENCOUNTER — Ambulatory Visit: Admitting: Family Medicine

## 2024-03-08 VITALS — BP 114/66 | HR 80 | Ht 70.0 in | Wt 173.6 lb

## 2024-03-08 DIAGNOSIS — G43709 Chronic migraine without aura, not intractable, without status migrainosus: Secondary | ICD-10-CM | POA: Diagnosis not present

## 2024-03-08 DIAGNOSIS — L680 Hirsutism: Secondary | ICD-10-CM | POA: Diagnosis not present

## 2024-03-08 DIAGNOSIS — Z8679 Personal history of other diseases of the circulatory system: Secondary | ICD-10-CM | POA: Diagnosis not present

## 2024-03-08 DIAGNOSIS — R7303 Prediabetes: Secondary | ICD-10-CM | POA: Diagnosis not present

## 2024-03-08 LAB — ESTRADIOL: Estradiol: 100 pg/mL

## 2024-03-09 ENCOUNTER — Encounter: Payer: Self-pay | Admitting: Family Medicine

## 2024-03-09 DIAGNOSIS — L680 Hirsutism: Secondary | ICD-10-CM | POA: Insufficient documentation

## 2024-03-09 DIAGNOSIS — Z8679 Personal history of other diseases of the circulatory system: Secondary | ICD-10-CM | POA: Insufficient documentation

## 2024-03-09 DIAGNOSIS — R7303 Prediabetes: Secondary | ICD-10-CM | POA: Insufficient documentation

## 2024-03-09 NOTE — Progress Notes (Signed)
 Diagnoses and Orders:   1. Hirsutism   2. Chronic migraine without aura without status migrainosus, not intractable   3. Prediabetes   4. History of supraventricular tachycardia    Orders Placed This Encounter  Procedures   Testos,Total,Free and SHBG (Female)   Estradiol   Assessment & Plan:   Assessment and Plan Assessment & Plan Migraine Chronic migraines managed with Ubrelvy  and Aimovig . Recent breakthrough migraines possibly due to sinus issues and weather changes. Discussed potential vessel damage from frequent migraines and benefits of current treatment in reducing frequency. - Continue Ubrelvy  and Aimovig . - Monitor for breakthrough migraines and potential triggers.  Prediabetes, Hirsutism Symptoms suggestive of PCOS include irregular periods, hair growth, and insulin resistance. Discussed [place] criteria and potential benefits of testing testosterone levels. Emphasized lifestyle modifications and potential medication options if lifestyle changes are insufficient. - Ordered testosterone and estradiol levels. - Discussed potential lifestyle modifications including diet and exercise. - Will consider referral to dietitian for dietary guidance. - Will discuss potential medication options if lifestyle changes are insufficient.  Uterine fibroid Monitoring of uterine fibroids with potential impact on menstrual cycles. No acute issues reported. - Continue monitoring with gynecologist.  Prediabetes A1c levels borderline, recent levels 6.2-6.4. Gestational diabetes and insulin resistance noted. Discussed risks of progression to diabetes and associated cardiovascular risks. Emphasized proactive management to prevent complications. Discussed potential impact of recent steroid use on A1c levels. - Encouraged increased water intake. - Advised on dietary modifications focusing on protein and reducing carbohydrate intake. - Recommended regular physical activity, such as  walking. - Will monitor A1c levels every three months. - Will consider fructosamine testing if needed.  General Health Maintenance Discussed importance of vaccinations, including flu and COVID. She has not received flu vaccine but has received COVID vaccines.  - Encouraged flu vaccination.   Geni Shutter, DO, MS, FAAFP, Dipl. [facility] [address] [place], [address] Dept: [phone] Dept Fax: [phone]  Subjective:   History of Present Illness Diane Kirk is a 48 year old female with migraines and prediabetes who presents for evaluation of recent illness and blood work results.  Recent upper respiratory infection - Sinus infection occurred in October - Required two courses of steroids and antibiotics - Associated with breathing difficulty requiring breathing treatments - Currently recovered with normal breathing  Migraine headaches - Treated with Ubrelvy  and Aimovig  - Occasional breakthrough migraines persist - Migraines often associated with sinus symptoms or weather changes - Overall migraine control is acceptable  Glycemic control and prediabetes - Prediabetes with borderline A1c - History of gestational diabetes - Recent A1c of 6.2 - Concern that recent steroid use may have elevated blood glucose  Lab Results  Component Value Date   HGBA1C 6.2 (A) 10/25/2021   HGBA1C 6.1 (A) 04/27/2021   HGBA1C 6.0 04/27/2021   Lab Results  Component Value Date   LDLCALC 79 04/27/2021   CREATININE 0.81 04/27/2021   Unintentional weight loss - Weight decreased from 180 lb to 170 lb - No intentional weight loss efforts - Feels best in the 167 to 170 lb range  Menstrual irregularity and uterine fibroids - Irregular menstrual cycles - Known uterine fibroids under gynecologic care  Cardiac symptoms - History of supraventricular tachycardia (SVT) - Past episodes of heart palpitations - Previously referred to  cardiology  Gastroesophageal reflux disease - History of reflux  Anxiety - History of anxiety  Vitamin d  deficiency - History of vitamin D  deficiency  Review of Systems: Negative, with the exception of above mentioned in HPI. Current Medications[1]   Objective:   BP 114/66 (BP Location: Right Arm, Cuff Size: Normal)   Pulse 80   Ht 5' 10 (1.778 m)   Wt 173 lb 9.6 oz (78.7 kg)   SpO2 99%   Breastfeeding No   BMI 24.91 kg/m   Physical Exam Constitutional:      General: She is not in acute distress.    Appearance: She is well-developed.  HENT:     Head: Normocephalic and atraumatic.  Eyes:     Conjunctiva/sclera: Conjunctivae normal.  Cardiovascular:     Rate and Rhythm: Normal rate and regular rhythm.     Heart sounds: Normal heart sounds.  Pulmonary:     Effort: Pulmonary effort is normal.     Breath sounds: Normal breath sounds.  Neurological:     General: No focal deficit present.     Mental Status: She is alert.  Psychiatric:        Behavior: Behavior normal.    Results for orders placed or performed in visit on 03/08/24  Estradiol   Collection Time: 03/08/24  2:03 PM  Result Value Ref Range   Estradiol 100 pg/mL    Attestations:   Patient is establishing care in this system with me as PCP., Available records reviewed., Chart updated today with reconciliation of problem list, medications, allergies, and relevant history. Preventive care and chronic disease status reviewed. , Portions of historical chart may remain incomplete; will update on an ongoing basis as clinically indicated. , and Discussed the use of AI scribe software for clinical note transcription with the patient, who gave verbal consent to proceed.     [1]  Current Outpatient Medications:    Erenumab -aooe (AIMOVIG ) 140 MG/ML SOAJ, Inject 140 mg into the skin every 28 (twenty-eight) days., Disp: 1.12 mL, Rfl: 11   Ubrogepant  (UBRELVY ) 100 MG TABS, Take 1 tablet (100 mg total) by mouth as  needed. May repeat after 2 hours.  Maximum 2 tablets in 24 hours., Disp: 16 tablet, Rfl: 11

## 2024-03-11 NOTE — Progress Notes (Unsigned)
 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 Keep headache diary Continue acupuncture and home exercises for neck pain Follow up 6 months.    Subjective:  Diane Kirk is a 48 year old female with psoriasis, SVT and anxiety who follows up for headaches.  MRI of brain personally reviewed.  UPDATE: Started Aimovig . Improved Intensity:  3/10 Duration:  within one hour with Ubrelvy  Frequency:  less than 1 a month.    Due to ongoing neck pain refractory to treatment, she had an MRI of the cervical spine without contrast on 10/04/2023 which revealed mild spondylosis with mild left foraminal stenoses at C2-C3 and C5-C6 secondary to uncovertebral joint disease.  She continues to go to an acupuncturist which helped, as well as physical therapy.  Continues to do home stretches.    Current NSAIDS/analgesics:  ibuprofen , may use Excedrin Migraine (not as effective as Ubrelvy ) Current triptans:  none Current ergotamine:  none Current anti-emetic:  none Current muscle relaxants:  none Current Antihypertensive medications:  diltiazem  Current Antidepressant medications:  none Current Anticonvulsant medications:  none Current anti-CGRP:  Aimovig  140mg , Ubrelvy  100mg  Current Vitamins/Herbal/Supplements:  none Current Antihistamines/Decongestants:  none Other therapy:  physical therapy Hormone/birth control:  none   HISTORY: Migraines Onset:  Mild headaches since her early 22s.  Worse since 2019 after birth of her daughter Location:  start behind  right eye and radiate to back of head at base of skull. Quality:  throbbing behind right eye, burning/shooting radiating to back of head Intensity:  5/10 Aura:  absent Prodrome:  absent Associated symptoms:  Sometimes left ptosis, photophobia and nausea.  She denies associated vomiting, phonophobia, visual disturbance, conjunctival injection, eye lacrimation, nasal congestion/rhinorrhea or unilateral numbness or weakness. Duration:  3 days, progresses from mild to severe.  With Ubrelvy , within an hour Frequency:  Variable.  May happen twice a week or may skip a week (total 8 to 10 days a month) Triggers:  Unknown Relieving factors:  no Activity:  aggravates  Neck pain/right cervical radiculopathy She does have associated neck pain. She saw orthopedics who told her that she had a pinched nerve in her neck that may respond to [facility].  She declined.  In 2022, started gabapentin  and migraines declined. She eventually discontinued it and was doing well until 2024 when they returned and started increasing in frequency.  Still with right sided shooting pain from back of neck and head to right eye but also has pressure in the left eye.  Lasts up to an hour with Ubrelvy .  Occurs about 7 days a month.  When turning her head to the right, she has an electric pain radiating down the right arm.    Vertigo:  BPPV She has had 3 bouts of vertigo in 2024.  A spinning sensation elicited with head movement.  Wakes up with it.  Usually lasts up to a week.  Meclizine  and the Epley maneuver helps.  Audiometric testing in June 2024 was normal.  She underwent a vestibular evaluation last month where the [facility] maneuver revealed mild but persistent left beating horizontal nystagmus but  not severe enough to be clinically significant.  Overall, testing was negative for peripheral and central deficits.  During the day she had vestibular testing, she had been experiencing a migraine headache.  For the entire day, she noted  blue orbs in her vision, which was new.  No recurrence.    Imaging: MRI of brain with and without contrast on 02/17/2023 revealed a few tiny nonspecific T2 FLAIR hyperintensies within the right frontal lobe white matter and a right frontal lobe developmental venous anomaly but otherwise unremarkable.  Past medications: Past NSAIDS/analgesics:  ibuprofen , Tylenol  Past abortive triptans:  rizatriptan , Zomig ZMT, sumatriptan  50mg  Past abortive ergotamine:  none Past muscle relaxants:  Robaxin Past anti-emetic:  none Past antihypertensive medications:  metoprolol  Past antidepressant medications:  amitriptyline Past anticonvulsant medications:  topiramate, gabapentin  Past anti-CGRP:  Qulipta  60mg  (palpitations) Past vitamins/Herbal/Supplements:  none Past antihistamines/decongestants:  meclizine  Other past therapies:  physical therapy for cervical pain   PAST MEDICAL HISTORY: Past Medical History:  Diagnosis Date   Allergy    Anemia    Anemia affecting pregnancy 10/23/2018   Anxiety    Complication of anesthesia    epidural did not work   Epidermoid cyst 12/25/2012   Gastroesophageal reflux disease    Gestational diabetes    glyburide   Gestational diabetes mellitus (GDM) affecting second pregnancy 10/06/2017   Hemoglobin C trait    Migraine headache    Psoriasis     MEDICATIONS: Current Outpatient Medications on File Prior to Visit  Medication Sig Dispense Refill   Erenumab -aooe (AIMOVIG ) 140 MG/ML SOAJ Inject 140 mg into the skin every 28 (twenty-eight) days. 1.12 mL 11   Ubrogepant  (UBRELVY ) 100 MG TABS Take 1 tablet (100 mg total) by mouth as needed. May repeat after 2 hours.  Maximum 2 tablets in 24 hours. 16 tablet 11   No current facility-administered medications on file prior to visit.    ALLERGIES: Allergies  Allergen Reactions   Tape Rash    Creates a bumpy rash    FAMILY HISTORY: Family History  Problem Relation Age of Onset   Hypertension Mother     Hyperlipidemia Mother    Hypertension Father    Hyperlipidemia Father    Liver cancer Father        mets to colon and pancreas   Colon cancer Father    Stroke Maternal Uncle    Diabetes Paternal Aunt    Kidney disease Paternal Aunt    Heart disease Paternal Uncle 57   Parkinsonism Maternal Grandmother    Dementia Maternal Grandmother    Breast cancer Maternal Grandmother    Heart disease Maternal Grandfather    Esophageal cancer Neg Hx    Stomach cancer Neg Hx    Rectal cancer Neg Hx       Objective:  Blood pressure 100/67, pulse 92, height 5' 10 (1.778 m), weight 174 lb 9.6 oz (79.2 kg), SpO2 98%. General: No acute distress.  Patient appears well-groomed.   Head:  Normocephalic/atraumatic Neck:  Supple.  No paraspinal tenderness.  Full range of motion. Heart:  Regular rate and rhythm. Neuro:  Alert and oriented.  Speech fluent and not dysarthric.  Language intact.  CN II-XII intact.  Bulk and tone normal.  Muscle strength 5/5 throughout.  Sensation to light touch intact.  Deep tendon reflexes 2+ throughout, toes downgoing.  Gait normal.  Romberg negative.   Juliene Dunnings, DO  CC: Tully Theophilus Andrews, MD

## 2024-03-12 ENCOUNTER — Encounter: Payer: Self-pay | Admitting: Neurology

## 2024-03-12 ENCOUNTER — Ambulatory Visit: Admitting: Neurology

## 2024-03-12 ENCOUNTER — Telehealth: Payer: Self-pay | Admitting: Neurology

## 2024-03-12 VITALS — BP 100/67 | HR 92 | Ht 70.0 in | Wt 174.6 lb

## 2024-03-12 DIAGNOSIS — G43009 Migraine without aura, not intractable, without status migrainosus: Secondary | ICD-10-CM

## 2024-03-12 DIAGNOSIS — M5412 Radiculopathy, cervical region: Secondary | ICD-10-CM

## 2024-03-12 MED ORDER — AIMOVIG 140 MG/ML ~~LOC~~ SOAJ: 140.0000 mg | SUBCUTANEOUS | 11 refills | Status: AC

## 2024-03-12 MED ORDER — UBRELVY 100 MG PO TABS: 1.0000 | ORAL_TABLET | ORAL | 11 refills | Status: AC | PRN

## 2024-03-12 NOTE — Telephone Encounter (Signed)
 [phone] called in today . Diane Kirk needs to know how many migraines Pt has pt month. Diane Kirk stated that it is for the prescription called:  Ubrogepant  (UBRELVY ) 100 MG TABS  and information is needed so  they can bill the insurance. Thanks

## 2024-03-13 MED ORDER — UBRELVY 100 MG PO TABS: 1.0000 | ORAL_TABLET | ORAL | 11 refills | Status: AC | PRN

## 2024-03-13 NOTE — Telephone Encounter (Signed)
 She typically has 8 to 10 headache days a month. Currently on treatment it is less than once a month but that can always change with management and it is effective. If 16 tablets a month is denied due to decreased frequency, then we can try for the 10 tablet quantity

## 2024-03-13 NOTE — Telephone Encounter (Signed)
 New script sent with added information.

## 2024-03-15 ENCOUNTER — Telehealth: Payer: Self-pay | Admitting: Pharmacy Technician

## 2024-03-15 ENCOUNTER — Other Ambulatory Visit (HOSPITAL_COMMUNITY): Payer: Self-pay

## 2024-03-15 NOTE — Telephone Encounter (Signed)
 Pharmacy Patient Advocate Encounter  Received notification from [facility] that Prior Authorization for UBRELVY  100MG  has been APPROVED from 12.19.25 to 12.19.26. Unable to obtain price due to refill too soon rejection, last fill date 12.17.25. next available fill date1.10.26   PA #/Case ID/Reference #: [id number]

## 2024-03-15 NOTE — Telephone Encounter (Signed)
 Pharmacy Patient Advocate Encounter   Received notification from CoverMyMeds that prior authorization for UBRELVY  100MG  is required/requested.   Insurance verification completed.   The patient is insured through [facility].   Per test claim: PA required; PA submitted to above mentioned insurance via Latent Key/confirmation #/EOC [facility] Status is pending

## 2024-04-24 ENCOUNTER — Ambulatory Visit: Admitting: Internal Medicine

## 2024-04-24 ENCOUNTER — Encounter: Admitting: Internal Medicine

## 2024-09-24 ENCOUNTER — Ambulatory Visit: Payer: Self-pay | Admitting: Neurology
